# Patient Record
Sex: Male | Born: 1975 | Race: White | Hispanic: No | Marital: Married | Smoking: Never smoker
Health system: Southern US, Community
[De-identification: ages and names within clinical notes are randomized; demographics above are authoritative.]

## PROBLEM LIST (undated history)

## (undated) DIAGNOSIS — F419 Anxiety disorder, unspecified: Secondary | ICD-10-CM

## (undated) DIAGNOSIS — Z87820 Personal history of traumatic brain injury: Secondary | ICD-10-CM

## (undated) DIAGNOSIS — K219 Gastro-esophageal reflux disease without esophagitis: Secondary | ICD-10-CM

## (undated) DIAGNOSIS — G43909 Migraine, unspecified, not intractable, without status migrainosus: Secondary | ICD-10-CM

## (undated) DIAGNOSIS — R000 Tachycardia, unspecified: Secondary | ICD-10-CM

## (undated) DIAGNOSIS — M722 Plantar fascial fibromatosis: Secondary | ICD-10-CM

## (undated) DIAGNOSIS — R739 Hyperglycemia, unspecified: Secondary | ICD-10-CM

## (undated) HISTORY — DX: Hyperglycemia, unspecified: R73.9

## (undated) HISTORY — PX: WISDOM TOOTH EXTRACTION: SHX21

## (undated) HISTORY — DX: Personal history of traumatic brain injury: Z87.820

## (undated) HISTORY — DX: Migraine, unspecified, not intractable, without status migrainosus: G43.909

## (undated) HISTORY — DX: Plantar fascial fibromatosis: M72.2

---

## 2001-07-18 ENCOUNTER — Emergency Department (HOSPITAL_COMMUNITY): Admission: EM | Admit: 2001-07-18 | Discharge: 2001-07-19 | Payer: Self-pay | Admitting: Emergency Medicine

## 2001-07-19 ENCOUNTER — Encounter: Payer: Self-pay | Admitting: Emergency Medicine

## 2001-07-20 ENCOUNTER — Emergency Department (HOSPITAL_COMMUNITY): Admission: EM | Admit: 2001-07-20 | Discharge: 2001-07-20 | Payer: Self-pay | Admitting: *Deleted

## 2012-08-07 LAB — HM HEPATITIS C SCREENING LAB: HM Hepatitis Screen: NEGATIVE

## 2012-08-07 LAB — HM HIV SCREENING LAB: HM HIV Screening: NEGATIVE

## 2014-12-22 ENCOUNTER — Encounter: Payer: Self-pay | Admitting: Family Medicine

## 2014-12-22 ENCOUNTER — Ambulatory Visit (INDEPENDENT_AMBULATORY_CARE_PROVIDER_SITE_OTHER): Payer: 59 | Admitting: Family Medicine

## 2014-12-22 VITALS — BP 132/84 | HR 67 | Temp 98.7°F | Ht 75.0 in | Wt 303.2 lb

## 2014-12-22 DIAGNOSIS — R739 Hyperglycemia, unspecified: Secondary | ICD-10-CM | POA: Diagnosis not present

## 2014-12-22 DIAGNOSIS — Z7189 Other specified counseling: Secondary | ICD-10-CM

## 2014-12-22 NOTE — Patient Instructions (Signed)
Use the eat right diet and keep working on your weight.   Send me your next set of labs at work.  I would get a flu shot each fall.   Take care.  Glad to see you.

## 2014-12-22 NOTE — Progress Notes (Signed)
Pre visit review using our clinic review tool, if applicable. No additional management support is needed unless otherwise documented below in the visit note.  New patient.  H/o mild hyperglycemia.  Weight is up, but he weighed 200# and was 6-2 in 9th grade.  Goal weight loss of 1-2# per month d/w pt.  He has cut out soda, is working on diet.  Exercise is usually at work, he is trying to get more.  Labs d/w pt.    PMH and SH reviewed  ROS: See HPI, otherwise noncontributory.  Meds, vitals, and allergies reviewed.   GEN: nad, alert and oriented HEENT: mucous membranes moist NECK: supple w/o LA CV: rrr.  no murmur PULM: ctab, no inc wob ABD: soft, +bs EXT: no edema SKIN: no acute rash

## 2014-12-23 DIAGNOSIS — Z7189 Other specified counseling: Secondary | ICD-10-CM | POA: Insufficient documentation

## 2014-12-23 DIAGNOSIS — R739 Hyperglycemia, unspecified: Secondary | ICD-10-CM | POA: Insufficient documentation

## 2014-12-23 NOTE — Assessment & Plan Note (Signed)
Mild, d/w pt.  He'll work on weight loss, goal 1-2# per month.  Getting BMI to 30 would be a big success.   He is likely not built to be very thin, given his hx and build.  D/w pt.   Handout given re: diet and low carb foods.   Continue to cut sugars, already off soda.  He agrees.  He'll get f/u labs at work, will send to me.

## 2015-01-26 ENCOUNTER — Encounter: Payer: Self-pay | Admitting: Family Medicine

## 2015-01-26 LAB — HEMOGLOBIN A1C
A1c: 5.8
Cholesterol, Total: 206
Creatinine, Ser: 0.77
Glucose: 107
HDL: 38 mg/dL (ref 35–70)
LDL (calc): 138
Triglycerides: 148

## 2015-03-19 LAB — LIPID PANEL
Cholesterol: 202 mg/dL — AB (ref 0–200)
HDL: 36 mg/dL (ref 35–70)
LDL Cholesterol: 128 mg/dL
Triglycerides: 188 mg/dL — AB (ref 40–160)

## 2015-03-19 LAB — BASIC METABOLIC PANEL: Glucose: 99 mg/dL

## 2015-04-22 ENCOUNTER — Ambulatory Visit (INDEPENDENT_AMBULATORY_CARE_PROVIDER_SITE_OTHER): Payer: 59 | Admitting: Family Medicine

## 2015-04-22 ENCOUNTER — Encounter: Payer: Self-pay | Admitting: Family Medicine

## 2015-04-22 VITALS — BP 118/80 | HR 82 | Temp 98.0°F | Wt 309.0 lb

## 2015-04-22 DIAGNOSIS — M779 Enthesopathy, unspecified: Secondary | ICD-10-CM

## 2015-04-22 DIAGNOSIS — M25512 Pain in left shoulder: Secondary | ICD-10-CM

## 2015-04-22 NOTE — Progress Notes (Signed)
Pre visit review using our clinic review tool, if applicable. No additional management support is needed unless otherwise documented below in the visit note.  Fell backward down 13 steps about 2 months ago.  He had rug burn but no bruising.  No LOC.  He didn't hit his head.  Was a little sore but was getting some better.  Then later on started having B trap pain and some tension headaches.  He has a numb spot on his L posterior shoulder near the scapula.  Normal L shoulder ROM.   He is clearly better but still with annoying enough sx to get checked.  He has been going to the chiropractor with some relief.  The HAs are better.   Rare tylenol/advil use.    R elbow pain.  Normal ROM.  No weakness.  Pain in the antecubital area.  No bruising.  Frequently lifting at work.    Pain in the R knee, posterior.  Worse with extreme of flexion, ie squatting.  No other trauma.  H/o known tight hamstrings.    Labs from work reviewed with patient.  D/w pt about weight loss as main tx.    Meds, vitals, and allergies reviewed.   ROS: See HPI.  Otherwise, noncontributory.  Nad ncat Neck supple L shoulder with normal inspection and ROM, no arm drop, but slight pain with int/ext rotation and supraspinatus testing.  + scap assist.   R biceps muscle and elbow not ttp but distal biceps tendon slightly ttp R knee with crepitus noted on ROM with SLR leg but hamstring tight.  Joint line not ttp.  Patella not ttp.  Normal R knee ROM. No bruising.

## 2015-04-22 NOTE — Patient Instructions (Signed)
Use the shoulder exercises and elbow sleeve.  Icing your elbow may help some.  Cushion inserts may help.  Hamstring stretch daily.  Take care.  Glad to see you.

## 2015-04-23 DIAGNOSIS — M25519 Pain in unspecified shoulder: Secondary | ICD-10-CM | POA: Insufficient documentation

## 2015-04-23 DIAGNOSIS — M779 Enthesopathy, unspecified: Secondary | ICD-10-CM | POA: Insufficient documentation

## 2015-04-23 NOTE — Assessment & Plan Note (Signed)
Likely biceps and hamstring tendonitis.  With likely OA changes in the R knee with crepitus noted.  D/w pt about relative rest for R arm, icing and sleeve as needed.   Reasonable to get inserts in boots and work on hamstring stretches in meantime.  If not improved, then update me.  We may need to get him to see Dr. Patsy Lager.   D/w pt about weight reduction.

## 2015-04-23 NOTE — Assessment & Plan Note (Signed)
No sign of full tear.  Likely with cuff irritation, either from the fall or from chronic strain at work.  Handout given, can use home exercises and update me as needed.  He agrees. Shoulder doesn't feel unstable.  Okay for outpatient f/u.

## 2015-05-16 ENCOUNTER — Encounter: Payer: Self-pay | Admitting: Family Medicine

## 2015-05-17 ENCOUNTER — Other Ambulatory Visit: Payer: Self-pay | Admitting: Family Medicine

## 2015-05-17 MED ORDER — HYDROXYZINE HCL 10 MG PO TABS: 10.0000 mg | ORAL_TABLET | Freq: Three times a day (TID) | ORAL | Status: DC | PRN

## 2015-05-27 ENCOUNTER — Encounter: Payer: Self-pay | Admitting: Family Medicine

## 2015-05-28 ENCOUNTER — Encounter: Payer: Self-pay | Admitting: Family Medicine

## 2015-06-03 ENCOUNTER — Ambulatory Visit (INDEPENDENT_AMBULATORY_CARE_PROVIDER_SITE_OTHER): Payer: 59 | Admitting: Family Medicine

## 2015-06-03 ENCOUNTER — Encounter: Payer: Self-pay | Admitting: Family Medicine

## 2015-06-03 VITALS — BP 132/78 | HR 81 | Temp 98.1°F | Wt 309.0 lb

## 2015-06-03 DIAGNOSIS — F4321 Adjustment disorder with depressed mood: Secondary | ICD-10-CM | POA: Diagnosis not present

## 2015-06-03 MED ORDER — ALPRAZOLAM 0.25 MG PO TABS: 0.2500 mg | ORAL_TABLET | Freq: Three times a day (TID) | ORAL | Status: DC | PRN

## 2015-06-03 NOTE — Patient Instructions (Signed)
Use the xanax if needed, sedation caution.  Update me as needed.

## 2015-06-03 NOTE — Assessment & Plan Note (Signed)
He is working through his situation, trying to process the paperwork as it comes in.  No SI/HI, okay for outpatient f/u.  He didn't want to go to counseling.  Hydroxyzine didn't help.  Okay to try xanax with routine cautions.  He agrees.  He'll update me as needed.   He may need another meds if sx are prolonged, ie SSRI, but he doesn't appear to be at that point yet.

## 2015-06-03 NOTE — Progress Notes (Signed)
He had a root canal this AM.  Is on abx.    Anxiety.  Hydroxyzine didn't help.  Mother was killed by a drunk driver- the other driver was caught after the event.  He is still having to deal with lawyers and paperwork, ie insurance.  No SI/HI.  He is realistic about the recent events.  He doesn't drink smoke or use drugs.  He can get irritated (understandibly) when insurance agents/etc call him to ask for repeat paperwork, details, etc.  He doesn't act out on the frustration.   He is working to lose weight with diet and exercise, d/w pt.   Form done for work.   Meds, vitals, and allergies reviewed.   ROS: See HPI.  Otherwise, noncontributory.  nad Affect slightly flat Speech fluent, judgement intact No SI/HI

## 2015-06-12 ENCOUNTER — Emergency Department
Admission: EM | Admit: 2015-06-12 | Discharge: 2015-06-12 | Disposition: A | Discharge status: Home / Self Care | Payer: 59 | Attending: Emergency Medicine | Admitting: Emergency Medicine

## 2015-06-12 ENCOUNTER — Encounter: Payer: Self-pay | Admitting: Emergency Medicine

## 2015-06-12 ENCOUNTER — Emergency Department: Payer: 59

## 2015-06-12 DIAGNOSIS — Z792 Long term (current) use of antibiotics: Secondary | ICD-10-CM | POA: Insufficient documentation

## 2015-06-12 DIAGNOSIS — Z79899 Other long term (current) drug therapy: Secondary | ICD-10-CM | POA: Insufficient documentation

## 2015-06-12 DIAGNOSIS — F329 Major depressive disorder, single episode, unspecified: Secondary | ICD-10-CM | POA: Insufficient documentation

## 2015-06-12 DIAGNOSIS — R079 Chest pain, unspecified: Secondary | ICD-10-CM | POA: Diagnosis present

## 2015-06-12 HISTORY — DX: Anxiety disorder, unspecified: F41.9

## 2015-06-12 LAB — CBC
HCT: 43.9 % (ref 40.0–52.0)
HEMOGLOBIN: 14.7 g/dL (ref 13.0–18.0)
MCH: 28.3 pg (ref 26.0–34.0)
MCHC: 33.6 g/dL (ref 32.0–36.0)
MCV: 84.1 fL (ref 80.0–100.0)
PLATELETS: 227 10*3/uL (ref 150–440)
RBC: 5.21 MIL/uL (ref 4.40–5.90)
RDW: 13 % (ref 11.5–14.5)
WBC: 10.1 10*3/uL (ref 3.8–10.6)

## 2015-06-12 LAB — BASIC METABOLIC PANEL
ANION GAP: 8 (ref 5–15)
BUN: 11 mg/dL (ref 6–20)
CALCIUM: 8.9 mg/dL (ref 8.9–10.3)
CO2: 28 mmol/L (ref 22–32)
CREATININE: 0.76 mg/dL (ref 0.61–1.24)
Chloride: 99 mmol/L — ABNORMAL LOW (ref 101–111)
Glucose, Bld: 115 mg/dL — ABNORMAL HIGH (ref 65–99)
Potassium: 3.8 mmol/L (ref 3.5–5.1)
SODIUM: 135 mmol/L (ref 135–145)

## 2015-06-12 LAB — TROPONIN I

## 2015-06-12 NOTE — ED Notes (Signed)
Pt to ed with c/o intermittent chest pain x 2 days.  Pt reports some sob,  Anxiety and stress.  Pt states his mother was killed about 1 month ago and has felt increased stress in his life.  Pt reports pain feels tight in his chest.

## 2015-06-12 NOTE — ED Provider Notes (Signed)
[facility] Emergency Department Provider Note  ____________________________________________  Time seen: 3:30 PM  I have reviewed the triage vital signs and the nursing notes.   HISTORY  Chief Complaint Chest Pain    HPI Roy Horton is a 39 y.o. male who presents with complaints of chest tightness that has been intermittent for the last week. He feels that it got worse today when he was returning home from packing up his mother's effects, she was recently struck by a drunk driver and killed and this is been causing him considerable anxiety and stress which he attributes his chest pain.Denies fevers chills. He denies shortness of breath to me. He does not smoke. He does have a history of mildly elevated blood pressure. Does not take blood pressure medications. Does get regular physicals. Reports mildly elevated lipids but no history of CAD. The pain does not radiate. He describes it as a tightness primarily occurs when he is struggling with stress     Past Medical History  Diagnosis Date  . Hyperglycemia   . Plantar fascia syndrome     s/p injection  . Migraines     improved with diet changes (cutting out MSG)  . History of concussion   . Anxiety     Patient Active Problem List   Diagnosis Date Noted  . Grief reaction 06/03/2015  . Pain in joint, shoulder region 04/23/2015  . Tendonitis 04/23/2015  . Advance care planning 12/23/2014  . Hyperglycemia 12/23/2014    Past Surgical History  Procedure Laterality Date  . Wisdom tooth extraction      Current Outpatient Rx  Name  Route  Sig  Dispense  Refill  . ALPRAZolam (XANAX) 0.25 MG tablet   Oral   Take 1 tablet (0.25 mg total) by mouth 3 (three) times daily as needed for anxiety (sedation caution).   30 tablet   1   . amoxicillin (AMOXIL) 500 MG capsule   Oral   Take 500 mg by mouth 3 (three) times daily.         . NON FORMULARY      Adren-all supplement.  2 tabs a day.            Allergies Avocado  Family History  Problem Relation Age of Onset  . Heart disease Father   . Colon cancer Neg Hx   . Prostate cancer Neg Hx     Social History Social History  Substance Use Topics  . Smoking status: Never Smoker   . Smokeless tobacco: Never Used  . Alcohol Use: No    Review of Systems  Constitutional: Negative for fever. Eyes: Negative for visual changes. ENT: Negative for sore throat Cardiovascular: Positive for chest tightness Respiratory: Negative for shortness of breath. Gastrointestinal: Negative for abdominal pain, vomiting and diarrhea. Genitourinary: Negative for dysuria. Musculoskeletal: Negative for back pain. Skin: Negative for rash. Neurological: Negative for headaches or focal weakness Psychiatric: Positive for depression    ____________________________________________   PHYSICAL EXAM:  VITAL SIGNS: ED Triage Vitals  Enc Vitals Group     BP 06/12/15 1400 177/86 mmHg     Pulse Rate 06/12/15 1400 85     Resp 06/12/15 1400 20     Temp 06/12/15 1400 98 F (36.7 C)     Temp Source 06/12/15 1400 Oral     SpO2 06/12/15 1400 100 %     Weight 06/12/15 1400 305 lb (138.347 kg)     Height 06/12/15 1400  (1.905 m)  Head Cir --      Peak Flow --      Pain Score 06/12/15 1400 5     Pain Loc --      Pain Edu? --      Excl. in GC? --     Constitutional: Alert and oriented. Well appearing and in no distress. Eyes: Conjunctivae are normal.  ENT   Head: Normocephalic and atraumatic.   Mouth/Throat: Mucous membranes are moist. Cardiovascular: Normal rate, regular rhythm. Normal and symmetric distal pulses are present in all extremities. No murmurs, rubs, or gallops. Respiratory: Normal respiratory effort without tachypnea nor retractions. Breath sounds are clear and equal bilaterally.  Gastrointestinal: Soft and non-tender in all quadrants. No distention. There is no CVA tenderness. Genitourinary:  deferred Musculoskeletal: Nontender with normal range of motion in all extremities. No lower extremity tenderness nor edema. Neurologic:  Normal speech and language. No gross focal neurologic deficits are appreciated. Skin:  Skin is warm, dry and intact. No rash noted. Psychiatric: Mood and affect are normal. Patient exhibits appropriate insight and judgment.  ____________________________________________    LABS (pertinent positives/negatives)  Labs Reviewed  BASIC METABOLIC PANEL - Abnormal; Notable for the following:    Chloride 99 (*)    Glucose, Bld 115 (*)    All other components within normal limits  CBC  TROPONIN I  TROPONIN I    ____________________________________________   EKG  ED ECG REPORT I, Jene EveryKINNER, Bayan Kushnir, the attending physician, personally viewed and interpreted this ECG.  Date: 06/12/2015 EKG Time: 2:03 PM Rate: 77 Rhythm: normal sinus rhythm QRS Axis: normal Intervals: normal ST/T Wave abnormalities: normal Conduction Disutrbances: none Narrative Interpretation: unremarkable   ____________________________________________    RADIOLOGY I have personally reviewed any xrays that were ordered on this patient: Chest x-ray normal  ____________________________________________   PROCEDURES  Procedure(s) performed: none  Critical Care performed: none  ____________________________________________   INITIAL IMPRESSION / ASSESSMENT AND PLAN / ED COURSE  Pertinent labs & imaging results that were available during my care of the patient were reviewed by me and considered in my medical decision making (see chart for details).  Patient well-appearing and in no distress. EKG is reassuring and initial cardiac enzymes are normal. Certainly does sound like he is having stress reactions and is causing chest tightness however we will recheck troponins and if negative we will have him follow-up with cardiology for stress test and further  evaluation  ____________________________________________   FINAL CLINICAL IMPRESSION(S) / ED DIAGNOSES  Final diagnoses:  Chest pain, unspecified chest pain type     Jene Everyobert Trisa Cranor, MD 06/12/15 [address] [id number]

## 2015-06-12 NOTE — Discharge Instructions (Signed)
Nonspecific Chest Pain  °Chest pain can be caused by many different conditions. There is always a chance that your pain could be related to something serious, such as a heart attack or a blood clot in your lungs. Chest pain can also be caused by conditions that are not life-threatening. If you have chest pain, it is very important to follow up with your health care provider. °CAUSES  °Chest pain can be caused by: °· Heartburn. °· Pneumonia or bronchitis. °· Anxiety or stress. °· Inflammation around your heart (pericarditis) or lung (pleuritis or pleurisy). °· A blood clot in your lung. °· A collapsed lung (pneumothorax). It can develop suddenly on its own (spontaneous pneumothorax) or from trauma to the chest. °· Shingles infection (varicella-zoster virus). °· Heart attack. °· Damage to the bones, muscles, and cartilage that make up your chest wall. This can include: °¨ Bruised bones due to injury. °¨ Strained muscles or cartilage due to frequent or repeated coughing or overwork. °¨ Fracture to one or more ribs. °¨ Sore cartilage due to inflammation (costochondritis). °RISK FACTORS  °Risk factors for chest pain may include: °· Activities that increase your risk for trauma or injury to your chest. °· Respiratory infections or conditions that cause frequent coughing. °· Medical conditions or overeating that can cause heartburn. °· Heart disease or family history of heart disease. °· Conditions or health behaviors that increase your risk of developing a blood clot. °· Having had chicken pox (varicella zoster). °SIGNS AND SYMPTOMS °Chest pain can feel like: °· Burning or tingling on the surface of your chest or deep in your chest. °· Crushing, pressure, aching, or squeezing pain. °· Dull or sharp pain that is worse when you move, cough, or take a deep breath. °· Pain that is also felt in your back, neck, shoulder, or arm, or pain that spreads to any of these areas. °Your chest pain may come and go, or it may stay  constant. °DIAGNOSIS °Lab tests or other studies may be needed to find the cause of your pain. Your health care provider may have you take a test called an ambulatory ECG (electrocardiogram). An ECG records your heartbeat patterns at the time the test is performed. You may also have other tests, such as: °· Transthoracic echocardiogram (TTE). During echocardiography, sound waves are used to create a picture of all of the heart structures and to look at how blood flows through your heart. °· Transesophageal echocardiogram (TEE). This is a more advanced imaging test that obtains images from inside your body. It allows your health care provider to see your heart in finer detail. °· Cardiac monitoring. This allows your health care provider to monitor your heart rate and rhythm in real time. °· Holter monitor. This is a portable device that records your heartbeat and can help to diagnose abnormal heartbeats. It allows your health care provider to track your heart activity for several days, if needed. °· Stress tests. These can be done through exercise or by taking medicine that makes your heart beat more quickly. °· Blood tests. °· Imaging tests. °TREATMENT  °Your treatment depends on what is causing your chest pain. Treatment may include: °· Medicines. These may include: °¨ Acid blockers for heartburn. °¨ Anti-inflammatory medicine. °¨ Pain medicine for inflammatory conditions. °¨ Antibiotic medicine, if an infection is present. °¨ Medicines to dissolve blood clots. °¨ Medicines to treat coronary artery disease. °· Supportive care for conditions that do not require medicines. This may include: °¨ Resting. °¨ Applying heat   or cold packs to injured areas. °¨ Limiting activities until pain decreases. °HOME CARE INSTRUCTIONS °· If you were prescribed an antibiotic medicine, finish it all even if you start to feel better. °· Avoid any activities that bring on chest pain. °· Do not use any tobacco products, including  cigarettes, chewing tobacco, or electronic cigarettes. If you need help quitting, ask your health care provider. °· Do not drink alcohol. °· Take medicines only as directed by your health care provider. °· Keep all follow-up visits as directed by your health care provider. This is important. This includes any further testing if your chest pain does not go away. °· If heartburn is the cause for your chest pain, you may be told to keep your head raised (elevated) while sleeping. This reduces the chance that acid will go from your stomach into your esophagus. °· Make lifestyle changes as directed by your health care provider. These may include: °¨ Getting regular exercise. Ask your health care provider to suggest some activities that are safe for you. °¨ Eating a heart-healthy diet. A registered dietitian can help you to learn healthy eating options. °¨ Maintaining a healthy weight. °¨ Managing diabetes, if necessary. °¨ Reducing stress. °SEEK MEDICAL CARE IF: °· Your chest pain does not go away after treatment. °· You have a rash with blisters on your chest. °· You have a fever. °SEEK IMMEDIATE MEDICAL CARE IF:  °· Your chest pain is worse. °· You have an increasing cough, or you cough up blood. °· You have severe abdominal pain. °· You have severe weakness. °· You faint. °· You have chills. °· You have sudden, unexplained chest discomfort. °· You have sudden, unexplained discomfort in your arms, back, neck, or jaw. °· You have shortness of breath at any time. °· You suddenly start to sweat, or your skin gets clammy. °· You feel nauseous or you vomit. °· You suddenly feel light-headed or dizzy. °· Your heart begins to beat quickly, or it feels like it is skipping beats. °These symptoms may represent a serious problem that is an emergency. Do not wait to see if the symptoms will go away. Get medical help right away. Call your local emergency services (911 in the [place] Do not drive yourself to the hospital. °  °This  information is not intended to replace advice given to you by your health care provider. Make sure you discuss any questions you have with your health care provider. °  °Document Released: 05/03/2005 Document Revised: 08/14/2014 Document Reviewed: 02/27/2014 °Elsevier Interactive Patient Education ©2016 [employer] ° °

## 2015-06-14 ENCOUNTER — Encounter: Payer: Self-pay | Admitting: Family Medicine

## 2015-06-15 ENCOUNTER — Other Ambulatory Visit: Payer: Self-pay | Admitting: Family Medicine

## 2015-06-15 MED ORDER — ALPRAZOLAM 0.5 MG PO TABS: 0.5000 mg | ORAL_TABLET | Freq: Two times a day (BID) | ORAL | Status: DC | PRN

## 2015-06-15 NOTE — Progress Notes (Unsigned)
Please call in.  Thanks.  Note dose change.

## 2015-06-17 ENCOUNTER — Encounter: Payer: Self-pay | Admitting: Family Medicine

## 2015-06-17 ENCOUNTER — Ambulatory Visit (INDEPENDENT_AMBULATORY_CARE_PROVIDER_SITE_OTHER): Payer: 59 | Admitting: Family Medicine

## 2015-06-17 VITALS — BP 130/76 | HR 75 | Temp 98.7°F | Ht 75.0 in | Wt 318.5 lb

## 2015-06-17 DIAGNOSIS — G2589 Other specified extrapyramidal and movement disorders: Secondary | ICD-10-CM | POA: Diagnosis not present

## 2015-06-17 NOTE — Patient Instructions (Signed)
YOUTUBE  Scapular stabilization exercises physical therapy  Pectoralis minor stretching   REFERRALS TO SPECIALISTS, SPECIAL TESTS (MRI, CT, ULTRASOUNDS)  [facility] or [facility] will help you. ASK CHECK-IN FOR HELP.  Imaging / Special Testing referrals sometimes can be done same day if EMERGENCY, but others can take 2 or 3 days to get an appointment. Starting in 2015, many of the new Medicare plans and Obamacare plans take much longer.   Specialist appointment times vary a great deal, based on their schedule / openings. -- Some specialists have very long wait times. (Example. Dermatology. Multiple months  for non-cancer)

## 2015-06-17 NOTE — Progress Notes (Signed)
Dr. Karleen Hampshire T. Amaurie Schreckengost, MD, CAQ Sports Medicine Primary Care and Sports Medicine [address] [place], [address] Phone: [phone] Fax: [phone]  06/17/2015  Patient: Roy Horton, MRN: [id number], DOB: [birth year], 39 y.o.  Primary Physician:  Crawford Givens, MD   Chief Complaint  Patient presents with  . Fall    x 2 months ago  . Shoulder Pain    Left   . Chest Pain    Left-Seen in ER-11/5 Following up with Dr. Lenard Galloway   Subjective:   Dear Dr. Para March,  Thank you for having me see Cloretta Ned in consultation today at [facility] at [facility] for his problem with L shoulder pain.  As you may recall, he is a 39 y.o. year old male with a history of a fall and approximately May or June 2016, where he fell down the stairs.  After this, for a brief period he had some pain which got somewhat better, and  Over the last few months it is progressively gotten worse.  His range of motion is a sickly preserved, but he has some pain with abduction and internal range of motion.  He also has pain in the scapular region, more inferiorly.  Patient also is had some chest pain, he was just in the emergency room on June 12, 2015.  He has follow-up with cardiology tomorrow.  Over the last week he has had some dull ache and pain in his shoulder and shoulder blade region.  Complicating things, he is an acute grief.  For the sudden death of his mother.  Patient Active Problem List   Diagnosis Date Noted  . Chest discomfort 06/18/2015  . Grief reaction 06/03/2015  . Pain in joint, shoulder region 04/23/2015  . Tendonitis 04/23/2015  . Advance care planning 12/23/2014  . Hyperglycemia 12/23/2014    Past Medical History  Diagnosis Date  . Hyperglycemia   . Plantar fascia syndrome     s/p injection  . Migraines     improved with diet changes (cutting out MSG)  . History of concussion   . Anxiety     Past Surgical History  Procedure Laterality Date  .  Wisdom tooth extraction      Social History   Social History  . Marital Status: Married    Spouse Name: N/A  . Number of Children: N/A  . Years of Education: N/A   Occupational History  . Not on file.   Social History Main Topics  . Smoking status: Never Smoker   . Smokeless tobacco: Never Used  . Alcohol Use: No  . Drug Use: No  . Sexual Activity: Not on file   Other Topics Concern  . Not on file   Social History Narrative   Married 2001   Works at [employer], [employer] architect    Family History  Problem Relation Age of Onset  . Heart disease Father   . Colon cancer Neg Hx   . Prostate cancer Neg Hx     Allergies  Allergen Reactions  . Avocado Other (See Comments)    GI upset    Medication list reviewed and updated in full in [facility].   GEN: No fevers, chills. Nontoxic. Primarily MSK c/o today. MSK: Detailed in the HPI GI: tolerating PO intake without difficulty Neuro: No numbness, parasthesias, or tingling associated. Otherwise the pertinent positives of the ROS are noted above.   Objective:   BP 130/76 mmHg  Pulse 75  Temp(Src) 98.7  F (37.1 C) (Oral)  Ht 6\' 3"  (1.905 m)  Wt 318 lb 8 oz (144.471 kg)  BMI 39.81 kg/m2    GEN: WDWN, NAD, Non-toxic, Alert & Oriented x 3 HEENT: Atraumatic, Normocephalic.  Ears and Nose: No external deformity. EXTR: No clubbing/cyanosis/edema NEURO: Normal gait.  PSYCH: Normally interactive. Conversant. Not depressed or anxious appearing.  Calm demeanor.   Shoulder: B Inspection: No muscle wasting or winging, FORWARD SLOPING SHOULDERS Ecchymosis/edema: neg  AC joint, scapula, clavicle: NT Cervical spine: NT, full ROM Spurling's: neg Abduction: full, 5/5, MILD PAIN Flexion: full, 5/5 IR, full, lift-off: 5/5 ER at neutral: full, 5/5 AC crossover and compression: neg Neer: neg Hawkins: MILD PAIN Drop Test: neg Empty Can: neg Supraspinatus insertion: NT Bicipital groove: NT Speed's: neg Yergason's:  neg Sulcus sign: neg Scapular dyskinesis: MILD-MOD WINGING, FORWARD SLOPING SHOULDERS, TIGHT PEC MINOR B C5-T1 intact Sensation intact Grip 5/5   Radiology:  Dg Chest 2 View  06/12/2015  CLINICAL DATA:  39 year old male with chest tightness for the past 2 days. Left-sided shoulder pain and numbness. EXAM: CHEST  2 VIEW COMPARISON:  No priors. FINDINGS: Lung volumes are normal. No consolidative airspace disease. No pleural effusions. No pneumothorax. No pulmonary nodule or mass noted. Pulmonary vasculature and the cardiomediastinal silhouette are within normal limits. IMPRESSION: No radiographic evidence of acute cardiopulmonary disease. Electronically Signed   By: Trudie Reedaniel  Entrikin M.D.   On: 06/12/2015 14:45    Assessment and Plan:   Scapular dyskinesis - Plan: Ambulatory referral to Physical Therapy  Very poor shoulder mechanics.  Some of this is likely due to a 20 year career lifting heavy boxes, but more likely secondary to trauma, and reaction to pain.  Primary goal is to work on scapular control and scapular stabilization, which I think will give the patient some improved subacromial space.  The patient's chest pain does not appear to be costochondritis or classic musculoskeletal chest pain.  Follow-up: 8 weeks  Orders Placed This Encounter  Procedures  . Ambulatory referral to Physical Therapy    Thank you for having us see Cloretta NedKeith R Dittrich in consultation.  Feel free to contact me with any questions.  Signed,  Elpidio GaleaSpencer T. Reece Fehnel, MD   Patient's Medications  New Prescriptions   No medications on file  Previous Medications   ALPRAZOLAM (XANAX) 0.5 MG TABLET    Take 1 tablet (0.5 mg total) by mouth 2 (two) times daily as needed for anxiety (sedation caution).  Modified Medications   No medications on file  Discontinued Medications   AMOXICILLIN (AMOXIL) 500 MG CAPSULE    Take 500 mg by mouth 3 (three) times daily.   NON FORMULARY    Adren-all supplement.  2 tabs a day.

## 2015-06-17 NOTE — Progress Notes (Signed)
Pre visit review using our clinic review tool, if applicable. No additional management support is needed unless otherwise documented below in the visit note. 

## 2015-06-18 ENCOUNTER — Ambulatory Visit (INDEPENDENT_AMBULATORY_CARE_PROVIDER_SITE_OTHER): Payer: 59 | Admitting: Cardiovascular Disease

## 2015-06-18 ENCOUNTER — Encounter: Payer: Self-pay | Admitting: Cardiovascular Disease

## 2015-06-18 ENCOUNTER — Encounter (INDEPENDENT_AMBULATORY_CARE_PROVIDER_SITE_OTHER): Payer: 59

## 2015-06-18 VITALS — BP 138/90 | HR 79 | Ht 76.0 in | Wt 314.0 lb

## 2015-06-18 DIAGNOSIS — R079 Chest pain, unspecified: Secondary | ICD-10-CM

## 2015-06-18 DIAGNOSIS — F4321 Adjustment disorder with depressed mood: Secondary | ICD-10-CM

## 2015-06-18 DIAGNOSIS — R739 Hyperglycemia, unspecified: Secondary | ICD-10-CM

## 2015-06-18 DIAGNOSIS — R0789 Other chest pain: Secondary | ICD-10-CM

## 2015-06-18 NOTE — Assessment & Plan Note (Signed)
Symptoms of his left chest possibly exacerbated by recent loss of his mother through traumatic circumstances

## 2015-06-18 NOTE — Patient Instructions (Signed)
Your stress test was normal.  Please call the office if you have any more chest pain  Call or return to clinic prn if these symptoms worsen or fail to improve as anticipated.

## 2015-06-18 NOTE — Patient Instructions (Signed)
You are doing well. No medication changes were made.  We will schedule a treadmill study for chest pain  Please call us if you have new issues that need to be addressed before your next appt.

## 2015-06-18 NOTE — Assessment & Plan Note (Signed)
We have encouraged continued exercise, careful diet management in an effort to lose weight. 

## 2015-06-18 NOTE — Assessment & Plan Note (Signed)
Atypical left-sided chest pain. No significant risk factors for coronary artery disease, no smoking, no diabetes. Cholesterol around 200. He is very concerned about his symptoms. Routine treadmill study will be ordered to rule out ischemia, arrhythmia. Given his chronic left shoulder discomfort, suspect symptoms are musculoskeletal in nature
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
## 2015-06-21 LAB — EXERCISE TOLERANCE TEST
CHL CUP RESTING HR STRESS: 93 {beats}/min
CSEPED: 9 min
CSEPEDS: 0 s
Estimated workload: 10.1 METS
MPHR: 153 {beats}/min
Peak HR: 171 {beats}/min
Percent HR: 111 %

## 2015-08-11 ENCOUNTER — Encounter: Payer: Self-pay | Admitting: Family Medicine

## 2015-08-13 ENCOUNTER — Other Ambulatory Visit: Payer: Self-pay | Admitting: Family Medicine

## 2015-08-13 ENCOUNTER — Other Ambulatory Visit: Payer: Self-pay

## 2015-08-13 MED ORDER — ALPRAZOLAM 0.5 MG PO TABS: 0.5000 mg | ORAL_TABLET | Freq: Two times a day (BID) | ORAL | Status: DC | PRN

## 2015-08-13 NOTE — Progress Notes (Signed)
Please call in xanax rx  Thanks 

## 2015-08-13 NOTE — Telephone Encounter (Signed)
Rx called into pharmacy Xanax as instructed per Para Marchuncan

## 2015-08-16 ENCOUNTER — Ambulatory Visit: Payer: 59 | Admitting: Family Medicine

## 2015-12-10 ENCOUNTER — Ambulatory Visit (INDEPENDENT_AMBULATORY_CARE_PROVIDER_SITE_OTHER)
Admission: RE | Admit: 2015-12-10 | Discharge: 2015-12-10 | Disposition: A | Discharge status: Home / Self Care | Payer: 59 | Source: Ambulatory Visit | Attending: Family Medicine | Admitting: Family Medicine

## 2015-12-10 ENCOUNTER — Encounter: Payer: Self-pay | Admitting: Family Medicine

## 2015-12-10 ENCOUNTER — Ambulatory Visit (INDEPENDENT_AMBULATORY_CARE_PROVIDER_SITE_OTHER): Payer: 59 | Admitting: Family Medicine

## 2015-12-10 VITALS — BP 148/92 | HR 78 | Temp 98.4°F | Wt 323.5 lb

## 2015-12-10 DIAGNOSIS — M542 Cervicalgia: Secondary | ICD-10-CM

## 2015-12-10 DIAGNOSIS — F4321 Adjustment disorder with depressed mood: Secondary | ICD-10-CM

## 2015-12-10 DIAGNOSIS — M25512 Pain in left shoulder: Secondary | ICD-10-CM

## 2015-12-10 MED ORDER — ALPRAZOLAM 0.5 MG PO TABS: 0.5000 mg | ORAL_TABLET | Freq: Two times a day (BID) | ORAL | Status: DC | PRN

## 2015-12-10 MED ORDER — GABAPENTIN 100 MG PO CAPS: 100.0000 mg | ORAL_CAPSULE | Freq: Every evening | ORAL | Status: DC | PRN
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
## 2015-12-10 NOTE — Patient Instructions (Signed)
Go to the lab on the way out.  We'll contact you with your xray report. Keep stretching, try 100-->300mg  gabapentin at night.   We'll be in touch.  Take care.  Glad to see you.

## 2015-12-12 NOTE — Assessment & Plan Note (Signed)
Continue prn BZD.  No ADE.  Okay to continue as is.  He is working through all of the legal requirements re: his mother's death, and processing the loss itself.

## 2015-12-12 NOTE — Assessment & Plan Note (Signed)
His neck pain is likely from from muscle tension and that is likely contributing to HA, d/w pt.  He does have some neuropathic sx with tingling but no weakness and no dec in DTRs.   Keep stretching, try 100mg -->300mg  gabapentin at night with routine cautions.  Check neck and shoulder xrays.   See notes on imaging.   He agrees.   >25 minutes spent in face to face time with patient, >50% spent in counselling or coordination of care.

## 2015-12-13 ENCOUNTER — Telehealth: Payer: Self-pay | Admitting: Family Medicine

## 2015-12-13 NOTE — Telephone Encounter (Signed)
Patient advised and is agreeable.

## 2015-12-13 NOTE — Telephone Encounter (Signed)
Call pt.  I talked with Dr. Patsy Lageropland.  I'm still not sure how much of his trouble is coming from the shoulder vs the neck vs one aggravating the other.   I think the gabapentin is still worth a try, but if not better with that then I think seeing Dr. Patsy Lageropland again would be useful.  Thanks.

## 2015-12-29 ENCOUNTER — Encounter: Payer: Self-pay | Admitting: Family Medicine

## 2016-01-12 ENCOUNTER — Ambulatory Visit (INDEPENDENT_AMBULATORY_CARE_PROVIDER_SITE_OTHER): Payer: 59 | Admitting: Family Medicine

## 2016-01-12 ENCOUNTER — Encounter: Payer: Self-pay | Admitting: Family Medicine

## 2016-01-12 VITALS — BP 160/98 | HR 77 | Temp 98.6°F | Ht 76.0 in | Wt 334.0 lb

## 2016-01-12 DIAGNOSIS — M25512 Pain in left shoulder: Secondary | ICD-10-CM | POA: Diagnosis not present

## 2016-01-12 NOTE — Progress Notes (Signed)
Dr. Karleen HampshireSpencer T. Naijah Lacek, MD, CAQ Sports Medicine Primary Care and Sports Medicine [address] [place], [id number] Phone: [phone] Fax: [phone]  01/12/2016  Patient: Roy Horton, MRN: [id number], DOB: 10/26/1975, 40 y.o.  Primary Physician:  Crawford GivensGraham Duncan, MD   Chief Complaint  Patient presents with  . Follow-up    Neck & Shoulder Pain   Subjective:   Roy NedKeith R Current is a 40 y.o. very pleasant male patient who presents with the following:  12 months - shoulder pain. Gabapentin and xanax has helped some with some headaches. He also was having some pain in the neck and shoulder. Also weakness in the shoulder. Larey SeatFell down the stairs about 1 year ago, s/p PT, NSAIDS, cont conservative care for months and now he has worsened - certainly not better.   Left sided shoulder pain and a numbness. That night will have a pancake, handling boxes all day long. Repetitive.   Headaches is really bothering him.   [facility]  Past Medical History, Surgical History, Social History, Family History, Problem List, Medications, and Allergies have been reviewed and updated if relevant.  Patient Active Problem List   Diagnosis Date Noted  . Chest discomfort 06/18/2015  . Grief reaction 06/03/2015  . Pain in joint, shoulder region 04/23/2015  . Tendonitis 04/23/2015  . Advance care planning 12/23/2014  . Hyperglycemia 12/23/2014    Past Medical History  Diagnosis Date  . Hyperglycemia   . Plantar fascia syndrome     s/p injection  . Migraines     improved with diet changes (cutting out MSG)  . History of concussion   . Anxiety     Past Surgical History  Procedure Laterality Date  . Wisdom tooth extraction      Social History   Social History  . Marital Status: Married    Spouse Name: N/A  . Number of Children: N/A  . Years of Education: N/A   Occupational History  . Not on file.   Social History Main Topics  . Smoking status: Never Smoker   .  Smokeless tobacco: Never Used  . Alcohol Use: No  . Drug Use: No  . Sexual Activity: Not on file   Other Topics Concern  . Not on file   Social History Narrative   Married 2001   Works at [employer], [employer]    Family History  Problem Relation Age of Onset  . Heart disease Father   . Colon cancer Neg Hx   . Prostate cancer Neg Hx     Allergies  Allergen Reactions  . Avocado Other (See Comments)    GI upset    Medication list reviewed and updated in full in [facility].  GEN: No fevers, chills. Nontoxic. Primarily MSK c/o today. MSK: Detailed in the HPI GI: tolerating PO intake without difficulty Neuro: No numbness, parasthesias, or tingling associated. Otherwise the pertinent positives of the ROS are noted above.   Objective:   BP 160/98 mmHg  Pulse 77  Temp(Src) 98.6 F (37 C) (Oral)  Ht 6\' 4"  (1.93 m)  Wt 334 lb (151.501 kg)  BMI 40.67 kg/m2   GEN: Well-developed,well-nourished,in no acute distress; alert,appropriate and cooperative throughout examination HEENT: Normocephalic and atraumatic without obvious abnormalities. Ears, externally no deformities PULM: Breathing comfortably in no respiratory distress EXT: No clubbing, cyanosis, or edema PSYCH: Normally interactive. Cooperative during the interview. Pleasant. Friendly and conversant. Not anxious or depressed appearing. Normal, full affect.  Shoulder: L  Inspection: No muscle wasting or winging Ecchymosis/edema: neg  AC joint, scapula, clavicle: NT Cervical spine: NT, full ROM Spurling's: neg Abduction: full, 4/5 Flexion: full, 5/5 IR, full, lift-off: 5/5 ER at neutral: full, 5/5 AC crossover: neg Neer: pos Hawkins: pos Drop Test: neg Empty Can: pos Supraspinatus insertion: mild-mod T Bicipital groove: NT Speed's: neg Yergason's: neg Sulcus sign: neg Scapular dyskinesis: none C5-T1 intact  Neuro: Sensation intact Grip 5/5   Radiology: Dg Cervical Spine Complete  12/10/2015   CLINICAL DATA:  Neck pain.  Fell down stairs 1 year ago. EXAM: CERVICAL SPINE - COMPLETE 4+ VIEW COMPARISON:  None. FINDINGS: Normal alignment. Disc spaces are maintained. Prevertebral soft tissues are normal. Early degenerative facet disease. No fracture. IMPRESSION: Early facet disease.  Normal alignment.  No fracture. Electronically Signed   By: Charlett Nose M.D.   On: 12/10/2015 10:42   Dg Shoulder Left  12/10/2015  CLINICAL DATA:  Pain left shoulder.  Fall.  Initial evaluation . EXAM: LEFT SHOULDER - 2+ VIEW COMPARISON:  No recent prior. FINDINGS: No acute bony or joint abnormality identified. No evidence of fracture or dislocation. IMPRESSION: No acute abnormality. Electronically Signed   By: Maisie Fus  Register   On: 12/10/2015 10:31    Assessment and Plan:   Shoulder pain, left - Plan: MR Shoulder Left Wo Contrast  Obtain an MRI of the L shoulder to evaluate for potential rotator cuff tear given weakness in abduction and fall down the stairs. Failure of traditional conservative measures including PT and NSAIDS x 12 months.   Some of additional pains he described likely from neck, and suggested that he could increase his neurontin dosing to BID or TID.   Orders Placed This Encounter  Procedures  . MR Shoulder Left Wo Contrast    Signed,  Delrico Minehart T. Monti Jilek, MD   Patient's Medications  New Prescriptions   No medications on file  Previous Medications   ALPRAZOLAM (XANAX) 0.5 MG TABLET    Take 1 tablet (0.5 mg total) by mouth 2 (two) times daily as needed for anxiety (sedation caution).   GABAPENTIN (NEURONTIN) 100 MG CAPSULE    Take 1-3 capsules (100-300 mg total) by mouth at bedtime as needed.  Modified Medications   No medications on file  Discontinued Medications   No medications on file

## 2016-01-12 NOTE — Patient Instructions (Signed)

## 2016-01-12 NOTE — Progress Notes (Signed)
Pre visit review using our clinic review tool, if applicable. No additional management support is needed unless otherwise documented below in the visit note. 

## 2016-01-14 ENCOUNTER — Ambulatory Visit (HOSPITAL_COMMUNITY)
Admission: RE | Admit: 2016-01-14 | Discharge: 2016-01-14 | Disposition: A | Discharge status: Home / Self Care | Payer: 59 | Source: Ambulatory Visit | Attending: Family Medicine | Admitting: Family Medicine

## 2016-01-14 ENCOUNTER — Other Ambulatory Visit: Payer: Self-pay | Admitting: Family Medicine

## 2016-01-14 DIAGNOSIS — M25512 Pain in left shoulder: Secondary | ICD-10-CM

## 2016-01-17 ENCOUNTER — Ambulatory Visit: Payer: 59 | Admitting: Family Medicine

## 2016-01-21 ENCOUNTER — Encounter: Payer: Self-pay | Admitting: Family Medicine

## 2016-01-22 ENCOUNTER — Ambulatory Visit
Admission: RE | Admit: 2016-01-22 | Discharge: 2016-01-22 | Disposition: A | Discharge status: Home / Self Care | Payer: 59 | Source: Ambulatory Visit | Attending: Family Medicine | Admitting: Family Medicine

## 2016-01-24 ENCOUNTER — Other Ambulatory Visit: Payer: Self-pay | Admitting: Family Medicine

## 2016-01-24 ENCOUNTER — Ambulatory Visit
Admission: RE | Admit: 2016-01-24 | Discharge: 2016-01-24 | Disposition: A | Discharge status: Home / Self Care | Payer: 59 | Source: Ambulatory Visit | Attending: Family Medicine | Admitting: Family Medicine

## 2016-01-24 DIAGNOSIS — M25512 Pain in left shoulder: Secondary | ICD-10-CM

## 2016-01-26 ENCOUNTER — Encounter: Payer: Self-pay | Admitting: Family Medicine

## 2016-01-27 ENCOUNTER — Other Ambulatory Visit: Payer: Self-pay | Admitting: *Deleted

## 2016-01-27 ENCOUNTER — Other Ambulatory Visit: Payer: Self-pay | Admitting: Family Medicine

## 2016-01-27 DIAGNOSIS — M67912 Unspecified disorder of synovium and tendon, left shoulder: Secondary | ICD-10-CM

## 2016-01-27 DIAGNOSIS — M25512 Pain in left shoulder: Secondary | ICD-10-CM

## 2016-01-27 DIAGNOSIS — M7552 Bursitis of left shoulder: Secondary | ICD-10-CM

## 2016-01-27 DIAGNOSIS — M75112 Incomplete rotator cuff tear or rupture of left shoulder, not specified as traumatic: Secondary | ICD-10-CM

## 2016-01-27 NOTE — Telephone Encounter (Signed)
Patient asked for refill through MyChart.  Patient says this is helping his headaches greatly.  Last Filled:    30 tablet 1 12/10/2015  Last office visit:   01/12/16 Dr. Patsy Lageropland for shoulder pain.  Please advise.

## 2016-01-28 MED ORDER — ALPRAZOLAM 0.5 MG PO TABS: 0.5000 mg | ORAL_TABLET | Freq: Two times a day (BID) | ORAL | Status: DC | PRN

## 2016-01-28 NOTE — Telephone Encounter (Signed)
Medication phoned to pharmacy.  

## 2016-03-27 ENCOUNTER — Encounter
Admission: RE | Admit: 2016-03-27 | Discharge: 2016-03-27 | Disposition: A | Discharge status: Home / Self Care | Payer: 59 | Source: Ambulatory Visit | Attending: Surgery | Admitting: Surgery

## 2016-03-27 HISTORY — DX: Gastro-esophageal reflux disease without esophagitis: K21.9

## 2016-03-27 NOTE — Patient Instructions (Signed)
  Your procedure is scheduled on: 03-30-16 Report to Same Day Surgery 2nd floor medical mall To find out your arrival time please call [phone] between 1PM - 3PM on 03-29-16  Remember: Instructions that are not followed completely may result in serious medical risk, up to and including death, or upon the discretion of your surgeon and anesthesiologist your surgery may need to be rescheduled.    _x___ 1. Do not eat food or drink liquids after midnight. No gum chewing or hard candies.     __x__ 2. No Alcohol for 24 hours before or after surgery.   __x__3. No Smoking for 24 prior to surgery.   ____  4. Bring all medications with you on the day of surgery if instructed.    __x__ 5. Notify your doctor if there is any change in your medical condition     (cold, fever, infections).     Do not wear jewelry, make-up, hairpins, clips or nail polish.  Do not wear lotions, powders, or perfumes. You may wear deodorant.  Do not shave 48 hours prior to surgery. Men may shave face and neck.  Do not bring valuables to the hospital.    [facility] is not responsible for any belongings or valuables.               Contacts, dentures or bridgework may not be worn into surgery.  Leave your suitcase in the car. After surgery it may be brought to your room.  For patients admitted to the hospital, discharge time is determined by your treatment team.   Patients discharged the day of surgery will not be allowed to drive home.    Please read over the following fact sheets that you were given:   [facility] Preparing for Surgery and or MRSA Information   _x___ Take these medicines the morning of surgery with A SIP OF WATER:    1. MAY TAKE XANAX AM OF SURGERY IF NEEDED  2.  3.  4.  5.  6.  ____ Fleet Enema (as directed)   ____ Use CHG Soap or sage wipes as directed on instruction sheet   ____ Use inhalers on the day of surgery and bring to hospital day of surgery  ____ Stop metformin 2 days  prior to surgery    ____ Take 1/2 of usual insulin dose the night before surgery and none on the morning of surgery.   ____ Stop aspirin or coumadin, or plavix  _x__ Stop Anti-inflammatories such as Advil, Aleve, Ibuprofen, Motrin, Naproxen,          Naprosyn, Goodies powders or aspirin products NOW-Ok to take Tylenol.   _X___ Stop supplements until after surgery-PT STOPPED TURMERIC AND FISH OIL ON 03-23-16  ____ Bring C-Pap to the hospital.

## 2016-03-27 NOTE — Pre-Procedure Instructions (Signed)
Roy NedKeith R Westerfeld  EXERCISE TOLERANCE TEST  Order# [id number]  Reading physician: Antonieta Ibaimothy J Gollan, MD Ordering physician: Antonieta Ibaimothy J Gollan, MD Study date: 06/18/15  Patient Information   Name MRN Description  Roy Horton [id number] 40 y.o. Male  Result Notes   Notes Recorded by Marilynne HalstedAmanda R Moody, RN on 06/21/2015 at 4:08 PM Dr. Mariah MillingGollan reviewed results w/ pt during visit.      Vitals   Height Weight BMI (Calculated)  6\' 4"  (1.93 m) 314 lb (142.4 kg) 38.3  Study Highlights   Exercise treadmill study performed for shortness of breath, chest discomfort Excellent exercise tolerance, achieved10.1 METS Total exercise time 9 minutes Peak blood pressure 217/72 Maximum heart rate achieved 171 bpm which was 95% of maximum predicted No ST changes concerning for ischemia at peak stress or in recovery Overall a normal study Results discussed with the patient at the time of the visit  Stress Findings   ECG Baseline ECG exhibits normal sinus rhythm..    Stress Findings The patient exercised following the Bruce protocol.  The patient reported no symptoms during the stress test. The patient experienced no angina during the stress test.   Test was stopped per protocol.   Blood pressure and heart rate demonstrated a normal response to exercise. Overall, the patient's exercise capacity was excellent.   85% of maximum heart rate was achieved after 6 minutes. The patient's response to exercise was adequate for diagnosis. Pt added on for gxt, wearing jeans. C/o being uncomfortably hot during test. Otherwise excellent tolerance.    Stress Measurements   Baseline Vitals  Rest HR 93 bpm    Rest BP 168/89 mmHg    Exercise Time  Exercise duration (min) 9 min    Exercise duration (sec) 0 sec    Peak Stress Vitals  Peak HR 171 bpm    Peak BP 217/72 mmHg    Exercise Data  MPHR 153 bpm    Percent HR 111 %    Estimated workload 10.1 METS       Signed   Electronically signed by  Antonieta Ibaimothy J Gollan, MD on 06/21/15 at 1401 EST  Report approved and finalized on 06/21/2015 1401  Imaging   Imaging Information  Order-Level Documents - 06/18/2015:   Scan on 06/18/2015 11:21 AM by Provider Default, MD      Encounter-Level Documents:   There are no encounter-level documents.  Exam Information   Status Exam Begun  Exam Ended   Final [99] 06/18/2015 10:29 AM 06/18/2015 11:05 AM  External Result Report   External Result Report  Order  Exercise Tolerance Test [[id number]][EKG1181] (Order [id number])  Order Providers   Authorizing Encounter Billing  Antonieta Ibaimothy J Gollan [facility] Antonieta Ibaimothy J Gollan      Original Order   Ordered On Ordered By   06/18/2015 10:22 AM Marilynne HalstedAmanda R Moody, RN           Associated Diagnoses    ICD-9-CM ICD-10-CM  Chest pain, unspecified chest pain type    786.50 R07.9  Order Questions

## 2016-03-30 ENCOUNTER — Other Ambulatory Visit: Payer: Self-pay | Admitting: Primary Care

## 2016-03-30 ENCOUNTER — Encounter: Payer: Self-pay | Admitting: *Deleted

## 2016-03-30 ENCOUNTER — Ambulatory Visit: Payer: 59 | Admitting: Anesthesiology

## 2016-03-30 ENCOUNTER — Encounter
Admission: RE | Disposition: A | Discharge status: Home / Self Care | Payer: Self-pay | Source: Ambulatory Visit | Attending: Surgery

## 2016-03-30 ENCOUNTER — Ambulatory Visit
Admission: RE | Admit: 2016-03-30 | Discharge: 2016-03-30 | Disposition: A | Discharge status: Home / Self Care | Payer: 59 | Source: Ambulatory Visit | Attending: Surgery | Admitting: Surgery

## 2016-03-30 DIAGNOSIS — R739 Hyperglycemia, unspecified: Secondary | ICD-10-CM | POA: Insufficient documentation

## 2016-03-30 DIAGNOSIS — M722 Plantar fascial fibromatosis: Secondary | ICD-10-CM | POA: Insufficient documentation

## 2016-03-30 DIAGNOSIS — M75102 Unspecified rotator cuff tear or rupture of left shoulder, not specified as traumatic: Secondary | ICD-10-CM | POA: Diagnosis present

## 2016-03-30 DIAGNOSIS — M7542 Impingement syndrome of left shoulder: Secondary | ICD-10-CM | POA: Insufficient documentation

## 2016-03-30 DIAGNOSIS — M75112 Incomplete rotator cuff tear or rupture of left shoulder, not specified as traumatic: Secondary | ICD-10-CM | POA: Insufficient documentation

## 2016-03-30 DIAGNOSIS — F419 Anxiety disorder, unspecified: Secondary | ICD-10-CM | POA: Diagnosis not present

## 2016-03-30 DIAGNOSIS — G43909 Migraine, unspecified, not intractable, without status migrainosus: Secondary | ICD-10-CM | POA: Diagnosis not present

## 2016-03-30 DIAGNOSIS — K219 Gastro-esophageal reflux disease without esophagitis: Secondary | ICD-10-CM | POA: Insufficient documentation

## 2016-03-30 DIAGNOSIS — Z8249 Family history of ischemic heart disease and other diseases of the circulatory system: Secondary | ICD-10-CM | POA: Diagnosis not present

## 2016-03-30 DIAGNOSIS — Z91018 Allergy to other foods: Secondary | ICD-10-CM | POA: Insufficient documentation

## 2016-03-30 HISTORY — PX: SHOULDER ARTHROSCOPY WITH OPEN ROTATOR CUFF REPAIR: SHX6092

## 2016-03-30 HISTORY — PX: SHOULDER ARTHROSCOPY WITH SUBACROMIAL DECOMPRESSION: SHX5684

## 2016-03-30 SURGERY — ARTHROSCOPY, SHOULDER WITH REPAIR, ROTATOR CUFF, OPEN
Anesthesia: General | Site: Shoulder | Laterality: Left | Wound class: Clean

## 2016-03-30 MED ORDER — ROPIVACAINE HCL 5 MG/ML IJ SOLN
INTRAMUSCULAR | Status: AC
  Filled 2016-03-30: qty 40

## 2016-03-30 MED ORDER — DEXTROSE 5 % IV SOLN
3.0000 g | Freq: Once | INTRAVENOUS | Status: AC
  Administered 2016-03-30: 3 g via INTRAVENOUS
  Filled 2016-03-30: qty 3000

## 2016-03-30 MED ORDER — NEOSTIGMINE METHYLSULFATE 10 MG/10ML IV SOLN
INTRAVENOUS | Status: DC | PRN
  Administered 2016-03-30: 3 mg via INTRAVENOUS

## 2016-03-30 MED ORDER — OXYCODONE HCL 5 MG/5ML PO SOLN: 5.0000 mg | Freq: Once | ORAL | Status: DC | PRN

## 2016-03-30 MED ORDER — MEPERIDINE HCL 25 MG/ML IJ SOLN: 6.2500 mg | INTRAMUSCULAR | Status: DC | PRN

## 2016-03-30 MED ORDER — PROPOFOL 10 MG/ML IV BOLUS
INTRAVENOUS | Status: DC | PRN
  Administered 2016-03-30: 20 mg via INTRAVENOUS
  Administered 2016-03-30: 200 mg via INTRAVENOUS

## 2016-03-30 MED ORDER — LIDOCAINE HCL (CARDIAC) 20 MG/ML IV SOLN
INTRAVENOUS | Status: DC | PRN
  Administered 2016-03-30: 60 mg via INTRAVENOUS

## 2016-03-30 MED ORDER — LACTATED RINGERS IV SOLN
INTRAVENOUS | Status: DC
  Administered 2016-03-30 (×2): via INTRAVENOUS

## 2016-03-30 MED ORDER — ONDANSETRON HCL 4 MG/2ML IJ SOLN
INTRAMUSCULAR | Status: DC | PRN
  Administered 2016-03-30: 4 mg via INTRAVENOUS

## 2016-03-30 MED ORDER — MIDAZOLAM HCL 5 MG/5ML IJ SOLN
1.0000 mg | Freq: Once | INTRAMUSCULAR | Status: AC
  Administered 2016-03-30: 1 mg via INTRAVENOUS

## 2016-03-30 MED ORDER — FENTANYL CITRATE (PF) 100 MCG/2ML IJ SOLN: 25.0000 ug | INTRAMUSCULAR | Status: DC | PRN

## 2016-03-30 MED ORDER — FENTANYL CITRATE (PF) 100 MCG/2ML IJ SOLN
INTRAMUSCULAR | Status: DC | PRN
  Administered 2016-03-30: 100 ug via INTRAVENOUS
  Administered 2016-03-30: 200 ug via INTRAVENOUS

## 2016-03-30 MED ORDER — ROPIVACAINE HCL 5 MG/ML IJ SOLN
INTRAMUSCULAR | Status: DC | PRN
  Administered 2016-03-30: 30 mL via PERINEURAL

## 2016-03-30 MED ORDER — ROPIVACAINE HCL 5 MG/ML IJ SOLN
INTRAMUSCULAR | Status: AC
  Filled 2016-03-30: qty 20

## 2016-03-30 MED ORDER — OXYCODONE HCL 5 MG PO TABS: 5.0000 mg | ORAL_TABLET | Freq: Once | ORAL | Status: DC | PRN

## 2016-03-30 MED ORDER — EPINEPHRINE HCL 1 MG/ML IJ SOLN
INTRAMUSCULAR | Status: DC | PRN
  Administered 2016-03-30: 2 mL

## 2016-03-30 MED ORDER — DEXMEDETOMIDINE HCL IN NACL 200 MCG/50ML IV SOLN
INTRAVENOUS | Status: DC | PRN
  Administered 2016-03-30: 8 ug via INTRAVENOUS

## 2016-03-30 MED ORDER — OXYCODONE HCL 5 MG PO TABS: 5.0000 mg | ORAL_TABLET | ORAL | 0 refills | Status: DC | PRN

## 2016-03-30 MED ORDER — LIDOCAINE HCL (PF) 4 % IJ SOLN
INTRAMUSCULAR | Status: DC | PRN
  Administered 2016-03-30: 4 mL via RESPIRATORY_TRACT

## 2016-03-30 MED ORDER — DEXAMETHASONE SODIUM PHOSPHATE 4 MG/ML IJ SOLN
INTRAMUSCULAR | Status: DC | PRN
  Administered 2016-03-30: 5 mg via INTRAVENOUS

## 2016-03-30 MED ORDER — EPINEPHRINE HCL 1 MG/ML IJ SOLN
INTRAMUSCULAR | Status: AC
  Filled 2016-03-30: qty 1

## 2016-03-30 MED ORDER — FAMOTIDINE 20 MG PO TABS
ORAL_TABLET | ORAL | Status: AC
  Administered 2016-03-30: 20 mg via ORAL
  Filled 2016-03-30: qty 1

## 2016-03-30 MED ORDER — ROCURONIUM BROMIDE 100 MG/10ML IV SOLN
INTRAVENOUS | Status: DC | PRN
  Administered 2016-03-30: 50 mg via INTRAVENOUS

## 2016-03-30 MED ORDER — BUPIVACAINE-EPINEPHRINE (PF) 0.5% -1:200000 IJ SOLN
INTRAMUSCULAR | Status: AC
  Filled 2016-03-30: qty 30

## 2016-03-30 MED ORDER — FAMOTIDINE 20 MG PO TABS
20.0000 mg | ORAL_TABLET | Freq: Once | ORAL | Status: AC
  Administered 2016-03-30: 20 mg via ORAL

## 2016-03-30 MED ORDER — GLYCOPYRROLATE 0.2 MG/ML IJ SOLN
INTRAMUSCULAR | Status: DC | PRN
  Administered 2016-03-30: 0.4 mg via INTRAVENOUS

## 2016-03-30 MED ORDER — LIDOCAINE HCL (PF) 1 % IJ SOLN
INTRAMUSCULAR | Status: AC
  Filled 2016-03-30: qty 5

## 2016-03-30 MED ORDER — PROMETHAZINE HCL 25 MG/ML IJ SOLN: 6.2500 mg | INTRAMUSCULAR | Status: DC | PRN

## 2016-03-30 MED ORDER — MIDAZOLAM HCL 2 MG/2ML IJ SOLN
INTRAMUSCULAR | Status: DC | PRN
  Administered 2016-03-30: 2 mg via INTRAVENOUS

## 2016-03-30 MED ORDER — MIDAZOLAM HCL 5 MG/5ML IJ SOLN
INTRAMUSCULAR | Status: AC
  Administered 2016-03-30: 1 mg via INTRAVENOUS
  Filled 2016-03-30: qty 5

## 2016-03-30 MED ORDER — BUPIVACAINE-EPINEPHRINE 0.5% -1:200000 IJ SOLN
INTRAMUSCULAR | Status: DC | PRN
  Administered 2016-03-30: 30 mL

## 2016-03-30 SURGICAL SUPPLY — 47 items
ANCH SUT 2 2.9 2 LD TPR NDL (Anchor) ×1 IMPLANT
ANCH SUT KNTLS STRL SHLDR SYS (Anchor) ×1 IMPLANT
ANCHOR JUGGERKNOT WTAP NDL 2.9 (Anchor) ×2 IMPLANT
ANCHOR SUT QUATTRO KNTLS 4.5 (Anchor) ×2 IMPLANT
BIT DRILL JUGRKNT W/NDL BIT2.9 (DRILL) ×1 IMPLANT
BLADE FULL RADIUS 3.5 (BLADE) ×2 IMPLANT
BLADE SHAVER 4.5X7 STR FR (MISCELLANEOUS) IMPLANT
BUR ACROMIONIZER 4.0 (BURR) ×2 IMPLANT
BUR BR 5.5 WIDE MOUTH (BURR) IMPLANT
CANNULA SHAVER 8MMX76MM (CANNULA) ×2 IMPLANT
CHLORAPREP W/TINT 26ML (MISCELLANEOUS) ×4 IMPLANT
COVER MAYO STAND STRL (DRAPES) ×2 IMPLANT
DRAPE IMP U-DRAPE 54X76 (DRAPES) ×4 IMPLANT
DRILL JUGGERKNOT W/NDL BIT 2.9 (DRILL) ×2
DRSG OPSITE POSTOP 4X8 (GAUZE/BANDAGES/DRESSINGS) ×2 IMPLANT
ELECT REM PT RETURN 9FT ADLT (ELECTROSURGICAL) ×2
ELECTRODE REM PT RTRN 9FT ADLT (ELECTROSURGICAL) ×1 IMPLANT
GAUZE PETRO XEROFOAM 1X8 (MISCELLANEOUS) ×2 IMPLANT
GAUZE SPONGE 4X4 12PLY STRL (GAUZE/BANDAGES/DRESSINGS) ×2 IMPLANT
GLOVE BIO SURGEON STRL SZ7.5 (GLOVE) ×4 IMPLANT
GLOVE BIO SURGEON STRL SZ8 (GLOVE) ×4 IMPLANT
GLOVE BIOGEL PI IND STRL 8 (GLOVE) ×1 IMPLANT
GLOVE BIOGEL PI INDICATOR 8 (GLOVE) ×1
GLOVE INDICATOR 8.0 STRL GRN (GLOVE) ×2 IMPLANT
GOWN STRL REUS W/ TWL LRG LVL3 (GOWN DISPOSABLE) ×2 IMPLANT
GOWN STRL REUS W/ TWL XL LVL3 (GOWN DISPOSABLE) ×1 IMPLANT
GOWN STRL REUS W/TWL LRG LVL3 (GOWN DISPOSABLE) ×4
GOWN STRL REUS W/TWL XL LVL3 (GOWN DISPOSABLE) ×2
GRASPER SUT 15 45D LOW PRO (SUTURE) IMPLANT
IV LACTATED RINGER IRRG 3000ML (IV SOLUTION) ×4
IV LR IRRIG 3000ML ARTHROMATIC (IV SOLUTION) ×2 IMPLANT
MANIFOLD NEPTUNE II (INSTRUMENTS) ×2 IMPLANT
MASK FACE SPIDER DISP (MASK) ×2 IMPLANT
MAT BLUE FLOOR 46X72 FLO (MISCELLANEOUS) ×2 IMPLANT
NEEDLE REVERSE CUT 1/2 CRC (NEEDLE) IMPLANT
PACK ARTHROSCOPY SHOULDER (MISCELLANEOUS) ×2 IMPLANT
SLING ARM LRG DEEP (SOFTGOODS) IMPLANT
SLING ULTRA II LG (MISCELLANEOUS) ×2 IMPLANT
STAPLER SKIN PROX 35W (STAPLE) ×2 IMPLANT
STRAP SAFETY BODY (MISCELLANEOUS) ×2 IMPLANT
SUT ETHIBOND 0 MO6 C/R (SUTURE) ×2 IMPLANT
SUT VIC AB 2-0 CT1 27 (SUTURE) ×4
SUT VIC AB 2-0 CT1 TAPERPNT 27 (SUTURE) ×2 IMPLANT
TAPE MICROFOAM 4IN (TAPE) ×2 IMPLANT
TUBING ARTHRO INFLOW-ONLY STRL (TUBING) ×2 IMPLANT
TUBING CONNECTING 10 (TUBING) ×2 IMPLANT
WAND HAND CNTRL MULTIVAC 90 (MISCELLANEOUS) ×2 IMPLANT

## 2016-03-30 NOTE — Anesthesia Postprocedure Evaluation (Signed)
Anesthesia Post Note  Patient: Roy Horton  Procedure(s) Performed: Procedure(s) (LRB): SHOULDER ARTHROSCOPY WITH OPEN ROTATOR CUFF PARTIAL THICKNESS REPAIR (Left) SHOULDER ARTHROSCOPY WITH SUBACROMIAL DECOMPRESSION AND DEBRIDEMENT (Left)  Patient location during evaluation: PACU Anesthesia Type: General Level of consciousness: awake and alert and oriented Pain management: pain level controlled Vital Signs Assessment: post-procedure vital signs reviewed and stable Respiratory status: spontaneous breathing, nonlabored ventilation and respiratory function stable Cardiovascular status: blood pressure returned to baseline and stable Postop Assessment: no signs of nausea or vomiting Anesthetic complications: no    Last Vitals:  Vitals:   03/30/16 1505 03/30/16 1521  BP: 125/73 132/86  Pulse: 66 66  Resp: 14 16  Temp: 36.8 C 36.8 C    Last Pain:  Vitals:   03/30/16 1521  TempSrc: Tympanic  PainSc: 0-No pain                 Shaquil Aldana

## 2016-03-30 NOTE — Transfer of Care (Signed)
Immediate Anesthesia Transfer of Care Note  Patient: Roy Horton  Procedure(s) Performed: Procedure(s): SHOULDER ARTHROSCOPY WITH OPEN ROTATOR CUFF PARTIAL THICKNESS REPAIR (Left) SHOULDER ARTHROSCOPY WITH SUBACROMIAL DECOMPRESSION AND DEBRIDEMENT (Left)  Patient Location: PACU  Anesthesia Type:General  Level of Consciousness: awake, alert , oriented and patient cooperative  Airway & Oxygen Therapy: Patient Spontanous Breathing and Patient connected to nasal cannula oxygen  Post-op Assessment: Report given to RN and Post -op Vital signs reviewed and stable  Post vital signs: Reviewed and stable  Last Vitals:  Vitals:   03/30/16 1203 03/30/16 1213  BP: (!) 157/101 (!) 156/96  Pulse: 72 72  Resp: 14 (!) 21  Temp:      Last Pain:  Vitals:   03/30/16 1008  TempSrc: Oral  PainSc: 3          Complications: No apparent anesthesia complications

## 2016-03-30 NOTE — Discharge Instructions (Addendum)
Keep dressing dry and intact.  °May shower after dressing changed on post-op day #4 (Monday).  °Cover staples with Band-Aids after drying off. °Apply ice frequently to shoulder. °Take ibuprofen 800 mg TID with meals for 7-10 days, then as necessary. °Take oxycodone as prescribed when needed.  °May supplement with ES Tylenol if necessary. °Keep shoulder immobilizer on at all times except may remove for bathing purposes. °Follow-up in 10-14 days or as scheduled. °AMBULATORY SURGERY  °DISCHARGE INSTRUCTIONS ° ° °1) The drugs that you were given will stay in your system until tomorrow so for the next 24 hours you should not: ° °A) Drive an automobile °B) Make any legal decisions °C) Drink any alcoholic beverage ° ° °2) You may resume regular meals tomorrow.  Today it is better to start with liquids and gradually work up to solid foods. ° °You may eat anything you prefer, but it is better to start with liquids, then soup and crackers, and gradually work up to solid foods. ° ° °3) Please notify your doctor immediately if you have any unusual bleeding, trouble breathing, redness and pain at the surgery site, drainage, fever, or pain not relieved by medication. ° ° ° °4) Additional Instructions: ° ° ° °Please contact your physician with any problems or Same Day Surgery at [phone], Monday through Friday 6 am to 4 pm, or [place] [facility] [place] Main number at [phone]. °

## 2016-03-30 NOTE — Anesthesia Procedure Notes (Signed)
Procedure Name: Intubation Date/Time: 03/30/2016 12:37 PM Performed by: Rosaria Ferries, Genisis Sonnier Pre-anesthesia Checklist: Patient identified, Emergency Drugs available, Suction available and Patient being monitored Patient Re-evaluated:Patient Re-evaluated prior to inductionOxygen Delivery Method: Circle system utilized Preoxygenation: Pre-oxygenation with 100% oxygen Intubation Type: IV induction Laryngoscope Size: Mac and 3 Grade View: Grade I Number of attempts: 1 Airway Equipment and Method: LTA kit utilized Placement Confirmation: ETT inserted through vocal cords under direct vision,  positive ETCO2 and breath sounds checked- equal and bilateral Secured at: 23 cm Tube secured with: Tape Dental Injury: Teeth and Oropharynx as per pre-operative assessment

## 2016-03-30 NOTE — Anesthesia Preprocedure Evaluation (Signed)
Anesthesia Evaluation  Patient identified by MRN, date of birth, ID band Patient awake    Reviewed: Allergy & Precautions, NPO status , Patient's Chart, lab work & pertinent test results  History of Anesthesia Complications Negative for: history of anesthetic complications  Airway Mallampati: I  TM Distance: >3 FB Neck ROM: Full    Dental  (+) Caps   Pulmonary neg pulmonary ROS, neg sleep apnea, neg COPD,    breath sounds clear to auscultation- rhonchi (-) wheezing      Cardiovascular Exercise Tolerance: Good (-) hypertension(-) CAD and (-) Past MI  Rhythm:Regular Rate:Normal - Systolic murmurs and - Diastolic murmurs    Neuro/Psych  Headaches, Anxiety    GI/Hepatic Neg liver ROS, GERD  ,  Endo/Other  negative endocrine ROSneg diabetes  Renal/GU negative Renal ROS     Musculoskeletal negative musculoskeletal ROS (+)   Abdominal (+) + obese,   Peds  Hematology negative hematology ROS (+)   Anesthesia Other Findings Past Medical History: No date: Anxiety No date: GERD (gastroesophageal reflux disease)     Comment: OCC No date: History of concussion No date: Hyperglycemia No date: Migraines     Comment: improved with diet changes (cutting out MSG) No date: Plantar fascia syndrome     Comment: s/p injection   Reproductive/Obstetrics                             Anesthesia Physical Anesthesia Plan  ASA: II  Anesthesia Plan: General   Post-op Pain Management:  Regional for Post-op pain   Induction: Intravenous  Airway Management Planned: Oral ETT  Additional Equipment:   Intra-op Plan:   Post-operative Plan: Extubation in OR  Informed Consent: I have reviewed the patients History and Physical, chart, labs and discussed the procedure including the risks, benefits and alternatives for the proposed anesthesia with the patient or authorized representative who has indicated his/her  understanding and acceptance.   Dental advisory given  Plan Discussed with: CRNA and Anesthesiologist  Anesthesia Plan Comments:         Anesthesia Quick Evaluation

## 2016-03-30 NOTE — Op Note (Signed)
03/30/2016  1:56 PM  Patient:   Roy Horton  Pre-Op Diagnosis:   Impingement/tendinopathy with partial-thickness rotator cuff tear, left shoulder.  Postoperative diagnosis: Impingement/tendinopathy with partial-thickness rotator cuff tear and labral fraying, left shoulder.  Procedure: Limited arthroscopic debridement, arthroscopic subacromial decompression, and mini-open rotator cuff repair, left shoulder.  Anesthesia: General endotracheal with interscalene block placed preoperatively by the anesthesiologist.  Surgeon:   Maryagnes AmosJ. Jeffrey Zygmund Passero, MD  Assistant:   Horris LatinoLance McGhee, PA-C  Findings: As above. The labrum demonstrated fraying anteriorly and superiorly, but there was no evidence of frank detachment from the glenoid. The biceps tendon itself was in excellent condition, as were the articular surfaces of the glenoid and humerus. The articular side of the rotator cuff was in excellent condition as well.  Complications: None  Fluids:   1000 cc  Estimated blood loss: 5 cc  Tourniquet time: None  Drains: None  Closure: Staples   Brief clinical note: The patient is a 40 year old Horton with a history of left shoulder pain. The patient's symptoms have progressed despite medications, activity modification, etc. The patient's history and examination are consistent with impingement/tendinopathy with a probable rotator cuff tear. An MRI scan of the left shoulder demonstrated moderate tendinopathy changes with a probable bursal surface partial thickness rotator cuff tear. The patient presents at this time for definitive management of these shoulder symptoms.  Procedure: The patient underwent placement of an interscalene block by the anesthesiologist in the preoperative holding area before he was brought into the operating room and lain in the supine position. The patient then underwent general endotracheal intubation and anesthesia before being repositioned in the beach  chair position using the beach chair positioner. The left shoulder and upper extremity were prepped with ChloraPrep solution before being draped sterilely. Preoperative antibiotics were administered. A timeout was performed to confirm the proper surgical site before the expected portal sites and incision site were injected with 0.5% Sensorcaine with epinephrine. A posterior portal was created and the glenohumeral joint thoroughly inspected with the findings as described above. An anterior portal was created using an outside-in technique. The labrum and rotator cuff were further probed, again confirming the above-noted findings. The areas of labral fraying and synovitis were lightly debrided using the full-radius resector before the ArthroCare wand was inserted and used to obtain hemostasis as well as to "anneal" the labrum superiorly and anteriorly. The instruments were removed from the joint after suctioning the excess fluid.  The camera was repositioned through the posterior portal into the subacromial space. A separate lateral portal was created using an outside-in technique. The 3.5 mm full-radius resector was introduced and used to perform a subtotal bursectomy. The ArthroCare wand was then inserted and used to remove the periosteal tissue off the undersurface of the anterior third of the acromion as well as to recess the coracoacromial ligament from its attachment along the anterior and lateral margins of the acromion. The 4.0 mm acromionizing bur was introduced and used to complete the decompression by removing the undersurface of the anterior third of the acromion. The full radius resector was reintroduced to remove any residual bony debris before the ArthroCare wand was reintroduced to obtain hemostasis. The rotator cuff was carefully inspected and demonstrated evidence of a partial-thickness flap tear of the bursal surface the rotator cuff involving the anterior insertional fibers of the supraspinatus  tendon with at least 50% involvement of the footprint thickness. The instruments were then removed from the subacromial space after suctioning the excess fluid.  An approximately 4-5 cm incision was made over the anterolateral aspect of the shoulder beginning at the anterolateral corner of the acromion and extending distally in line with the bicipital groove. This incision was carried down through the subcutaneous tissues to expose the deltoid fascia. The raphae between the anterior and middle thirds was identified and this plane developed to provide access into the subacromial space. Additional bursal tissues were debrided sharply using Metzenbaum scissors. The rotator cuff tear was readily identified. The margins were debrided sharply with a #15 blade and the exposed greater tuberosity roughened with a rongeur. The tear was repaired using a single Biomet 2.9 mm JuggerKnot anchor. These sutures were then brought back laterally and secured using a [employer] anchor to create a two-layer closure. An apparent watertight closure was obtained.  The wound was copiously irrigated with sterile saline solution before the deltoid raphae was reapproximated using 2-0 Vicryl interrupted sutures. The subcutaneous tissues were closed in two layers using 2-0 Vicryl interrupted sutures before the skin was closed using staples. The portal sites also were closed using staples. A sterile bulky dressing was applied to the shoulder before the arm was placed into a shoulder immobilizer. The patient was then awakened, extubated, and returned to the recovery room in satisfactory condition after tolerating the procedure well.

## 2016-03-30 NOTE — H&P (Signed)
Paper H&P to be scanned into permanent record. H&P reviewed. No changes. 

## 2016-03-30 NOTE — Telephone Encounter (Signed)
MyChart Rx Request:  Last Filled:     30 tablet 0 01/28/2016  Last office visit:   01/12/16  Please advise.

## 2016-03-30 NOTE — Anesthesia Procedure Notes (Signed)
Anesthesia Regional Block:  Interscalene brachial plexus block  Pre-Anesthetic Checklist: ,, timeout performed, Correct Patient, Correct Site, Correct Laterality, Correct Procedure, Correct Position, site marked, Risks and benefits discussed,  Surgical consent,  Pre-op evaluation,  At surgeon's request and post-op pain management  Laterality: Left  Prep: chloraprep       Needles:  Injection technique: Single-shot  Needle Type: Stimiplex     Needle Length: 5cm 5 cm Needle Gauge: 22 and 22 G    Additional Needles:  Procedures: ultrasound guided (picture in chart) Interscalene brachial plexus block Narrative:  Start time: 03/30/2016 11:05 AM End time: 03/30/2016 11:18 AM Injection made incrementally with aspirations every 5 mL.  Performed by: Personally  Anesthesiologist: Ajane Novella  Additional Notes: Functioning IV was confirmed and monitors were applied.  A 50mm 22ga Stimuplex needle was used. Sterile prep and drape,hand hygiene and sterile gloves were used.  Negative aspiration and negative test dose prior to incremental administration of local anesthetic. The patient tolerated the procedure well.

## 2016-03-31 ENCOUNTER — Encounter: Payer: Self-pay | Admitting: Surgery

## 2016-03-31 MED ORDER — ALPRAZOLAM 0.5 MG PO TABS: 0.5000 mg | ORAL_TABLET | Freq: Two times a day (BID) | ORAL | 0 refills | Status: DC | PRN

## 2016-03-31 NOTE — Telephone Encounter (Signed)
Please call in.  Thanks.   

## 2016-03-31 NOTE — Telephone Encounter (Signed)
Medication phoned to pharmacy.  

## 2016-07-19 ENCOUNTER — Other Ambulatory Visit: Payer: Self-pay | Admitting: Family Medicine

## 2016-07-20 NOTE — Telephone Encounter (Signed)
MyChart renewal request.  Last Filled:    30 tablet 0 03/31/2016  Last office visit:   01/12/16 acute [facility].  Please advise.

## 2016-07-21 MED ORDER — ALPRAZOLAM 0.5 MG PO TABS: 0.5000 mg | ORAL_TABLET | Freq: Two times a day (BID) | ORAL | 0 refills | Status: DC | PRN

## 2016-07-21 NOTE — Telephone Encounter (Signed)
[facility]

## 2016-07-21 NOTE — Telephone Encounter (Signed)
Please call in.  Thanks.   

## 2016-09-04 ENCOUNTER — Other Ambulatory Visit: Payer: Self-pay | Admitting: Family Medicine

## 2016-09-05 MED ORDER — ALPRAZOLAM 0.5 MG PO TABS: 0.5000 mg | ORAL_TABLET | Freq: Two times a day (BID) | ORAL | 0 refills | Status: DC | PRN

## 2016-09-05 NOTE — Telephone Encounter (Signed)
Rx called to pharmacy as instructed. 

## 2016-09-05 NOTE — Telephone Encounter (Signed)
Please call in.  Thanks.   

## 2016-10-29 ENCOUNTER — Other Ambulatory Visit: Payer: Self-pay | Admitting: Family Medicine

## 2016-10-29 MED ORDER — ALPRAZOLAM 0.5 MG PO TABS: 0.5000 mg | ORAL_TABLET | Freq: Two times a day (BID) | ORAL | 0 refills | Status: DC | PRN

## 2016-10-29 NOTE — Telephone Encounter (Signed)
Please call in.  Thanks.   

## 2016-10-29 NOTE — Telephone Encounter (Signed)
Last office visit 01/12/16 with Dr. Patsy Lageropland for shoulder pain. Last refilled 09/05/16 for #30 with no refills.  Ok to refill?

## 2016-10-30 NOTE — Telephone Encounter (Signed)
Rx called in to requested pharmacy 

## 2016-12-25 ENCOUNTER — Other Ambulatory Visit: Payer: Self-pay | Admitting: Family Medicine

## 2016-12-26 NOTE — Telephone Encounter (Signed)
MyChart Renewal.  Last office visit:   01/22/16 Acute with Dr. Patsy Lageropland. Last Filled:    30 tablet 0 10/29/2016  Please advise.

## 2016-12-27 MED ORDER — ALPRAZOLAM 0.5 MG PO TABS: 0.5000 mg | ORAL_TABLET | Freq: Two times a day (BID) | ORAL | 0 refills | Status: DC | PRN

## 2016-12-27 NOTE — Telephone Encounter (Signed)
Rx called in to requested pharmacy 

## 2016-12-27 NOTE — Telephone Encounter (Signed)
Please call in.  Thanks.   

## 2017-03-19 ENCOUNTER — Other Ambulatory Visit: Payer: Self-pay | Admitting: Family Medicine

## 2017-03-19 NOTE — Telephone Encounter (Signed)
Electronic refill request. Alprazolam Last office visit:   12/10/15 Last Filled:    30 tablet 0 12/27/2016  Please advise.

## 2017-03-20 MED ORDER — ALPRAZOLAM 0.5 MG PO TABS: 0.5000 mg | ORAL_TABLET | Freq: Two times a day (BID) | ORAL | 0 refills | Status: DC | PRN

## 2017-03-20 NOTE — Telephone Encounter (Signed)
Please call in.  Schedule office visit when possible. Thanks.

## 2017-03-20 NOTE — Telephone Encounter (Signed)
Patient advised to schedule OV.  Medication phoned to pharmacy.

## 2017-04-12 ENCOUNTER — Ambulatory Visit (INDEPENDENT_AMBULATORY_CARE_PROVIDER_SITE_OTHER): Payer: 59 | Admitting: Family Medicine

## 2017-04-12 ENCOUNTER — Encounter: Payer: Self-pay | Admitting: Family Medicine

## 2017-04-12 VITALS — BP 142/90 | HR 79 | Temp 98.6°F | Wt 332.0 lb

## 2017-04-12 DIAGNOSIS — F4321 Adjustment disorder with depressed mood: Secondary | ICD-10-CM

## 2017-04-12 DIAGNOSIS — R739 Hyperglycemia, unspecified: Secondary | ICD-10-CM

## 2017-04-12 DIAGNOSIS — F4320 Adjustment disorder, unspecified: Secondary | ICD-10-CM | POA: Diagnosis not present

## 2017-04-12 DIAGNOSIS — R0982 Postnasal drip: Secondary | ICD-10-CM

## 2017-04-12 MED ORDER — FLUTICASONE PROPIONATE 50 MCG/ACT NA SUSP: 2.0000 | Freq: Every day | NASAL | 6 refills | Status: DC
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
## 2017-04-12 NOTE — Patient Instructions (Addendum)
Try flonase and see if that helps. Add that to loratadine .  Update me as needed.  Take care.  Glad to see you.

## 2017-04-13 DIAGNOSIS — R0982 Postnasal drip: Secondary | ICD-10-CM | POA: Insufficient documentation

## 2017-04-13 NOTE — Assessment & Plan Note (Signed)
He is trying to gradually work through the situation. Discussed with patient. He does not use benzodiazepine daily. Uses intermittently. No adverse effect on medication. He is trying to "stretch" the prescription as long as he can. Continue as is. Update me as needed. The goal is for him to gradually need benzodiazepine less and less and gradually taper off. >25 minutes spent in face to face time with patient, >50% spent in counselling or coordination of care. Okay for outpatient f/u.

## 2017-04-13 NOTE — Assessment & Plan Note (Signed)
Has used Claritin previously. Can add on Flonase. Update me as needed. He agrees.

## 2017-04-13 NOTE — Assessment & Plan Note (Signed)
Noted on A1c. Labs discussed with patient. He is going to continue work on [employer] diet and exercise. Form done for work.

## 2017-04-17 ENCOUNTER — Encounter: Payer: Self-pay | Admitting: Family Medicine

## 2017-04-17 LAB — LAB REPORT - SCANNED
A1C: 6.2
A1c: 6.4
Cholesterol, Total: 175
Cholesterol, Total: 206
Creatinine, Ser: 0.73
Creatinine, Ser: 0.76
GLUCOSE: 113
Glucose: 111
HDL Cholesterol: 34
HDL: 35
LDL Cholesterol (Calc): 123
LDL Cholesterol (Calc): 141
TRIGLYCERIDES: 151
Triglycerides: 89

## 2017-06-19 ENCOUNTER — Other Ambulatory Visit: Payer: Self-pay | Admitting: Family Medicine

## 2017-06-20 MED ORDER — ALPRAZOLAM 0.5 MG PO TABS: 0.5000 mg | ORAL_TABLET | Freq: Two times a day (BID) | ORAL | 0 refills | Status: DC | PRN

## 2017-06-20 NOTE — Telephone Encounter (Signed)
Rx called to pharmacy as instructed. 

## 2017-06-20 NOTE — Telephone Encounter (Signed)
Please call in.  Thanks.   

## 2017-06-20 NOTE — Telephone Encounter (Signed)
Electronic refill Last refill 03/20/17 #30 Last office visit 04/12/17

## 2018-01-29 IMAGING — DX DG SHOULDER 2+V*L*
3 series · 4 of 4 positions shown · non-contrast
Comparison: No recent prior.

CLINICAL DATA: Pain left shoulder.  Fall.  Initial evaluation .

EXAM:
LEFT SHOULDER - 2+ VIEW

[Series 1: shoulder axial · 0.14mm/px · 2 of 2 slices shown]
[im 1/2]
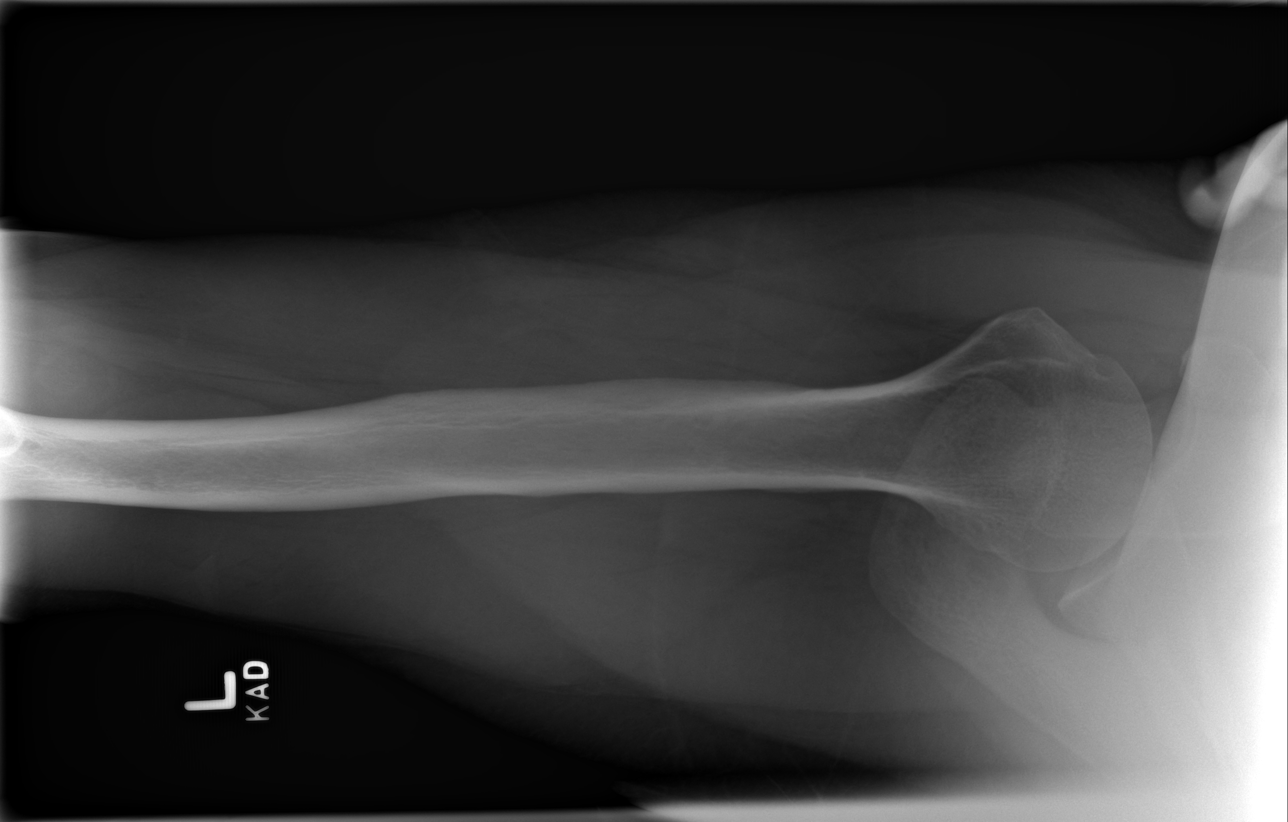
[im 2/2]
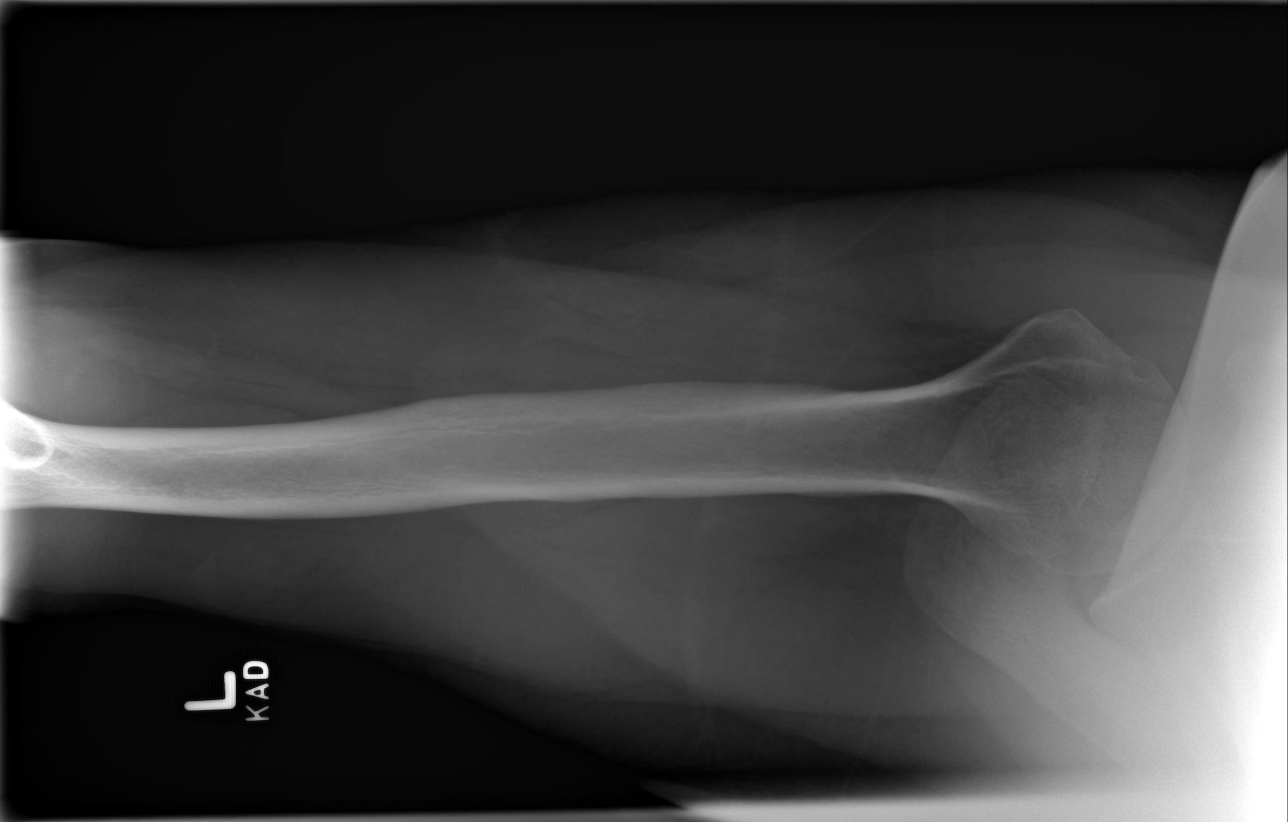

[shoulder ap]
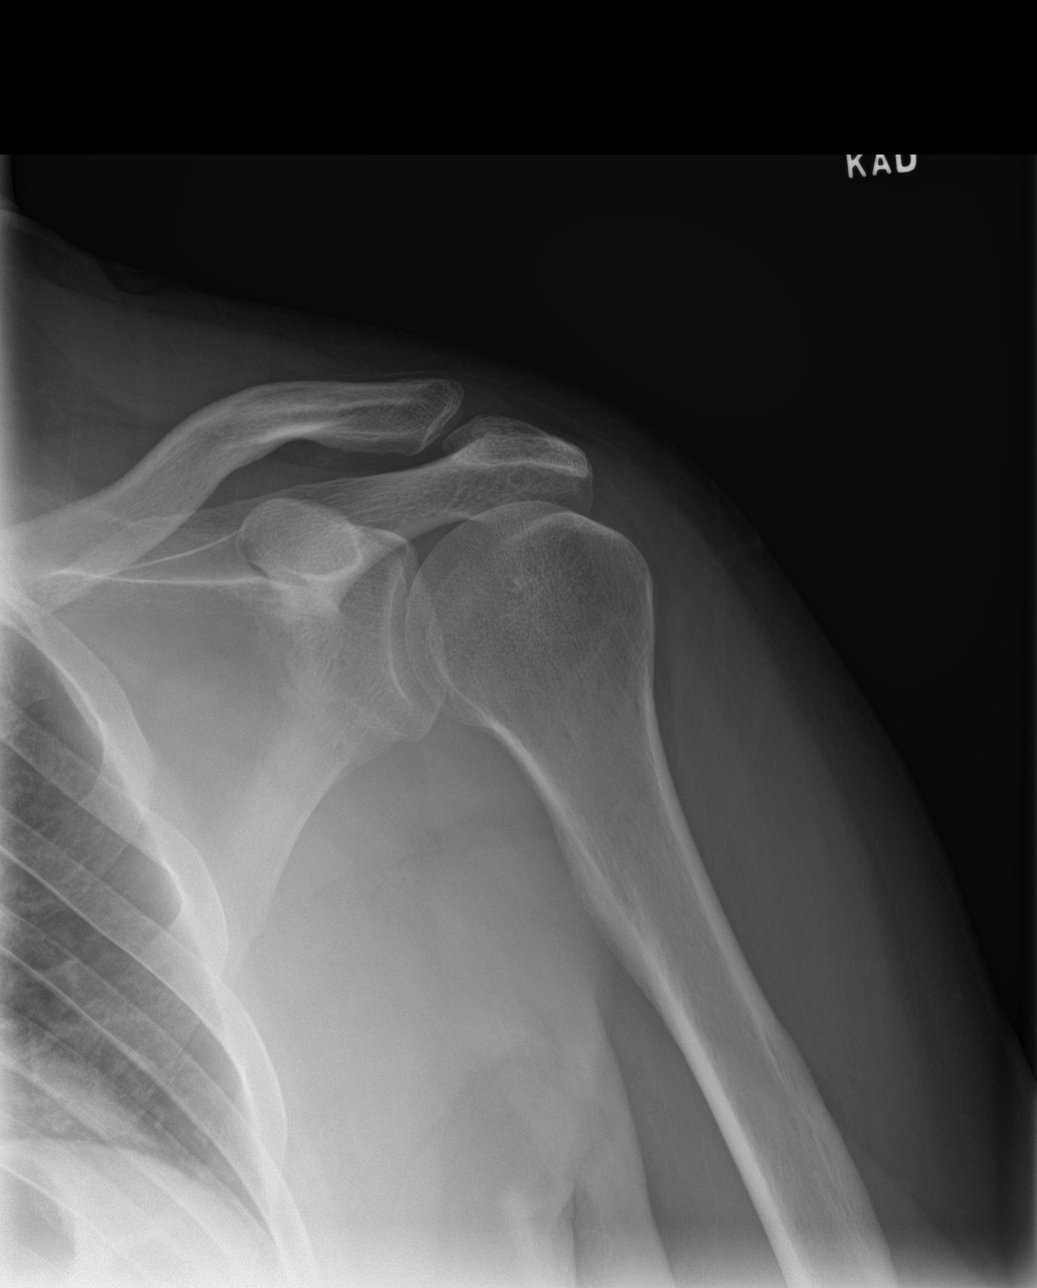

[shoulder y-view]
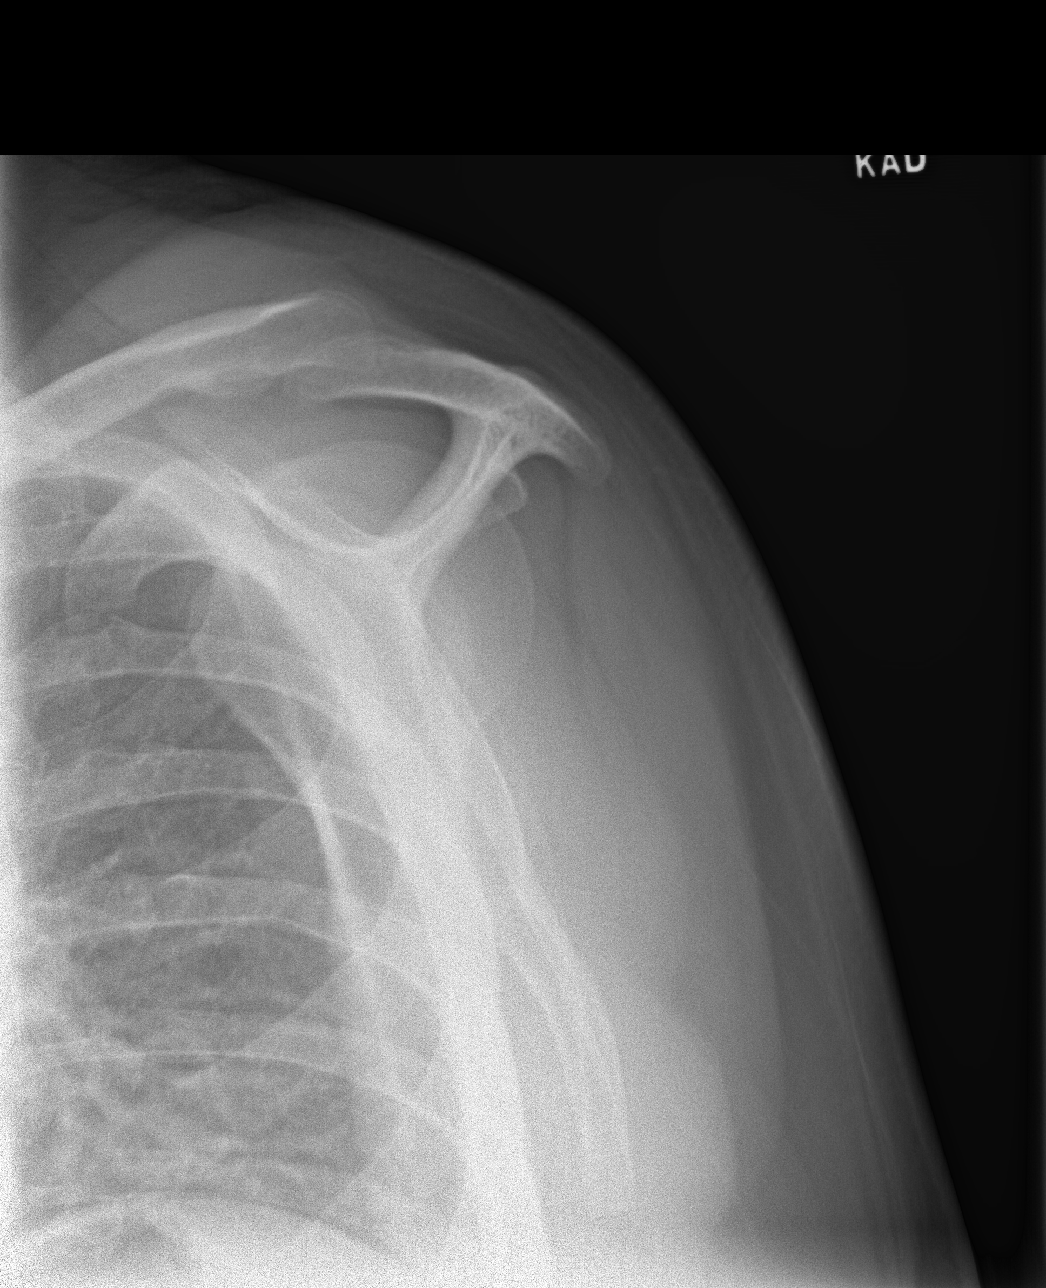

[4 of 4 positions shown; findings below may reference images not displayed]

FINDINGS: No acute bony or joint abnormality identified. No evidence of
fracture or dislocation.
IMPRESSION: No acute abnormality.

## 2018-05-14 ENCOUNTER — Ambulatory Visit (INDEPENDENT_AMBULATORY_CARE_PROVIDER_SITE_OTHER): Payer: Managed Care, Other (non HMO) | Admitting: Family Medicine

## 2018-05-14 ENCOUNTER — Encounter: Payer: Self-pay | Admitting: Family Medicine

## 2018-05-14 VITALS — BP 142/92 | HR 67 | Temp 98.6°F | Ht 75.0 in | Wt 300.2 lb

## 2018-05-14 DIAGNOSIS — Z7189 Other specified counseling: Secondary | ICD-10-CM

## 2018-05-14 DIAGNOSIS — Z0000 Encounter for general adult medical examination without abnormal findings: Secondary | ICD-10-CM | POA: Diagnosis not present

## 2018-05-14 DIAGNOSIS — Z23 Encounter for immunization: Secondary | ICD-10-CM | POA: Diagnosis not present

## 2018-05-14 DIAGNOSIS — F4321 Adjustment disorder with depressed mood: Secondary | ICD-10-CM

## 2018-05-14 DIAGNOSIS — R0982 Postnasal drip: Secondary | ICD-10-CM
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
## 2018-05-14 NOTE — Patient Instructions (Signed)
Flu shot at work when possible.  Keep working on diet and exercise.  Thanks for your effort.  MMR today.  Take care.  Glad to see you.  Update me as needed.   

## 2018-05-15 DIAGNOSIS — Z0000 Encounter for general adult medical examination without abnormal findings: Secondary | ICD-10-CM | POA: Insufficient documentation

## 2018-05-15 NOTE — Assessment & Plan Note (Signed)
He has been working through all of the upheaval and is doing better.  He will update me as needed.

## 2018-05-15 NOTE — Assessment & Plan Note (Signed)
Tetanus 2017 Flu to be done at work.   PNA not due.  Shingles not due.   Colon and prostate cancer screening not due.  Living will d/w pt.  Wife designated if patient were incapacitated.  HIV and HCV screening done ~[facility] at [facility].    Labs discussed with patient.  He is working on diet and exercise, d/w pt.  He has intentional weight loss.  Form done for work.    MMR vaccination d/w pt.  Reasonable to get repeat dose based on mumps Ab titer.

## 2018-05-15 NOTE — Assessment & Plan Note (Signed)
Living will d/w pt.  Wife designated if patient were incapacitated.   ?

## 2018-05-15 NOTE — Assessment & Plan Note (Signed)
Improved with allergy medications used at baseline.  Update me as needed.

## 2018-05-16 ENCOUNTER — Encounter: Payer: Self-pay | Admitting: Family Medicine

## 2018-05-16 LAB — LAB REPORT - SCANNED
A1C: 5.5
CHOLESTEROL: 192
Creatinine, Ser: 0.68
Glucose: 94
HDL: 37
LDL (calc): 123
MUMPS ABS, IGG: <9
RUBELLA: 2.75
RUBEOLA AB, IGG: 219
TRIGLYCERIDES: 160

## 2018-12-03 ENCOUNTER — Encounter: Payer: Self-pay | Admitting: Family Medicine

## 2018-12-03 NOTE — Telephone Encounter (Signed)
Last office visit 05/14/2018.  Not on current medication list.

## 2018-12-04 ENCOUNTER — Other Ambulatory Visit: Payer: Self-pay | Admitting: Family Medicine

## 2018-12-04 MED ORDER — ALPRAZOLAM 0.5 MG PO TABS: 0.5000 mg | ORAL_TABLET | Freq: Every day | ORAL | 1 refills | Status: DC | PRN

## 2019-10-10 ENCOUNTER — Ambulatory Visit: Payer: Managed Care, Other (non HMO) | Attending: Internal Medicine

## 2019-10-10 DIAGNOSIS — Z23 Encounter for immunization: Secondary | ICD-10-CM | POA: Insufficient documentation

## 2019-10-10 NOTE — Progress Notes (Signed)
   Covid-19 Vaccination Clinic  Name:  Roy Horton    MRN: [id number] DOB: 1976-07-04  10/10/2019  Roy Horton was observed post Covid-19 immunization for 15 minutes without incident. He was provided with Vaccine Information Sheet and instruction to access the V-Safe system.   Roy Horton was instructed to call 911 with any severe reactions post vaccine: Marland Kitchen Difficulty breathing  . Swelling of face and throat  . A fast heartbeat  . A bad rash all over body  . Dizziness and weakness   Immunizations Administered    Name Date Dose VIS Date Route   Pfizer COVID-19 Vaccine 10/10/2019  8:32 AM 0.3 mL 07/18/2019 Intramuscular   Manufacturer: [employer], [employer]   Lot: [id number]   NDC: [address]-6

## 2019-10-31 ENCOUNTER — Ambulatory Visit: Payer: Managed Care, Other (non HMO) | Attending: Internal Medicine

## 2019-10-31 DIAGNOSIS — Z23 Encounter for immunization: Secondary | ICD-10-CM

## 2019-10-31 NOTE — Progress Notes (Signed)
   Covid-19 Vaccination Clinic  Name:  Roy Horton    MRN: [id number] DOB: Jan 04, 1976  10/31/2019  Mr. Roy Horton was observed post Covid-19 immunization for 15 minutes without incident. He was provided with Vaccine Information Sheet and instruction to access the V-Safe system.   Mr. Roy Horton was instructed to call 911 with any severe reactions post vaccine: Marland Kitchen Difficulty breathing  . Swelling of face and throat  . A fast heartbeat  . A bad rash all over body  . Dizziness and weakness   Immunizations Administered    Name Date Dose VIS Date Route   Pfizer COVID-19 Vaccine 10/31/2019  8:11 AM 0.3 mL 07/18/2019 Intramuscular   Manufacturer: [employer], [employer]   Lot: [id number]   NDC: [address]-7

## 2020-01-08 ENCOUNTER — Encounter: Payer: Self-pay | Admitting: Family Medicine

## 2020-01-08 ENCOUNTER — Ambulatory Visit: Payer: Managed Care, Other (non HMO) | Admitting: Family Medicine

## 2020-01-08 ENCOUNTER — Other Ambulatory Visit: Payer: Self-pay

## 2020-01-08 VITALS — BP 170/102 | HR 81 | Temp 97.7°F | Ht 75.0 in | Wt 318.4 lb

## 2020-01-08 DIAGNOSIS — I10 Essential (primary) hypertension: Secondary | ICD-10-CM | POA: Diagnosis not present

## 2020-01-08 MED ORDER — LISINOPRIL 10 MG PO TABS: 10.0000 mg | ORAL_TABLET | Freq: Every day | ORAL | 3 refills | Status: DC

## 2020-01-08 NOTE — Progress Notes (Signed)
This visit occurred during the SARS-CoV-2 public health emergency.  Safety protocols were in place, including screening questions prior to the visit, additional usage of staff PPE, and extensive cleaning of exam room while observing appropriate contact time as indicated for disinfecting solutions.  He had his covid vaccine.    He had sig inc in work hours due to covid, d/w pt.    He went to eye clinic, noted BP elevation.  No vision loss.  His BP had been controlled in the past.  He checked BP at home, still with SBP ~155.   We talked about diet and weight and his fatigue related to work schedule.  He is aware of need for work on diet and exercise.  He is working a lot at work.  We talked about diet and limiting salt.    He doesn't smoke or drink.    No exertional CP, SOB, BLE edema.    Meds, vitals, and allergies reviewed.   ROS: Per HPI unless specifically indicated in ROS section   GEN: nad, alert and oriented HEENT: ncat NECK: supple w/o LA CV: rrr PULM: ctab, no inc wob ABD: soft, +bs EXT: no edema SKIN: no acute rash

## 2020-01-08 NOTE — Patient Instructions (Addendum)
Check DASH diet and [email or web address].   Start lisinopril 10mg  a day.  If BP is still >140/>90 1 week, then increase to 20mg  a day.  Update me about your BP in about 10 days, sooner if needed.  Drink enough water to keep your urine clear.  Limit ibuprofen and aleve.  Take care.  Glad to see you.

## 2020-01-09 LAB — BASIC METABOLIC PANEL
BUN/Creatinine Ratio: 16 (ref 9–20)
BUN: 12 mg/dL (ref 6–24)
CO2: 26 mmol/L (ref 20–29)
Calcium: 9.5 mg/dL (ref 8.7–10.2)
Chloride: 102 mmol/L (ref 96–106)
Creatinine, Ser: 0.73 mg/dL — ABNORMAL LOW (ref 0.76–1.27)
GFR calc Af Amer: 130 mL/min/{1.73_m2} (ref >59)
GFR calc non Af Amer: 112 mL/min/{1.73_m2} (ref >59)
Glucose: 127 mg/dL — ABNORMAL HIGH (ref 65–99)
Potassium: 4.5 mmol/L (ref 3.5–5.2)
Sodium: 142 mmol/L (ref 134–144)

## 2020-01-11 DIAGNOSIS — I10 Essential (primary) hypertension: Secondary | ICD-10-CM | POA: Insufficient documentation

## 2020-01-11 NOTE — Assessment & Plan Note (Signed)
Discussed options Advised him to look at [facility] diet and [email or web address].   Start lisinopril 10mg  a day.  If BP is still >140/>90 1 week, then increase to 20mg  a day.  He can update me about his BP in about 10 days, sooner if needed.  Drink enough water to keep your urine clear.  Limit ibuprofen and aleve.  He agrees with plan.

## 2020-04-27 ENCOUNTER — Ambulatory Visit: Payer: Managed Care, Other (non HMO) | Admitting: Family Medicine

## 2020-04-27 ENCOUNTER — Other Ambulatory Visit: Payer: Self-pay

## 2020-04-27 ENCOUNTER — Encounter: Payer: Self-pay | Admitting: Family Medicine

## 2020-04-27 DIAGNOSIS — I10 Essential (primary) hypertension: Secondary | ICD-10-CM | POA: Diagnosis not present

## 2020-04-27 NOTE — Progress Notes (Signed)
This visit occurred during the SARS-CoV-2 public health emergency.  Safety protocols were in place, including screening questions prior to the visit, additional usage of staff PPE, and extensive cleaning of exam room while observing appropriate contact time as indicated for disinfecting solutions.  He has labs from work.  D/w pt.  He had covid vaccine prev done.  Labs discussed with patient.  See hardcopy.  Taking 10mg  lisinopril and doing well with that with BP controlled.  Intentional weight loss.  Working on diet and exercise.  I thanked him for his effort.  Is BP is clearly controlled today.  BP controlled at home.  No CP, not SOB. Prev headaches resolved.    Flu shot to be done at work.    Lipids reasonable on labs at work.    Meds, vitals, and allergies reviewed.  ROS: Per HPI unless specifically indicated in ROS section   GEN: nad, alert and oriented HEENT: ncat NECK: supple w/o LA CV: rrr. PULM: ctab, no inc wob ABD: soft, +bs EXT: no edema SKIN: no acute rash

## 2020-04-27 NOTE — Patient Instructions (Signed)
If you get lightheaded, then cut back on lisinopril.  Update me as needed.  Thanks for your effort.

## 2020-04-28 NOTE — Assessment & Plan Note (Signed)
Continue work on diet and exercise.  Continue lisinopril as is for now.  If he gets lightheaded then he can cut back on the dose.  He will update me as needed.  He agrees.  I thanked him for his effort.

## 2020-05-28 ENCOUNTER — Encounter: Payer: Self-pay | Admitting: Family Medicine

## 2020-05-28 LAB — LAB REPORT - SCANNED
A1c: 6.2
Cholesterol: 151
Creatinine, Ser: 0.78
Glucose: 113
HDL: 35
LDL (calc): 99
Triglycerides: 90 (ref 40–160)

## 2020-11-30 ENCOUNTER — Ambulatory Visit: Payer: Managed Care, Other (non HMO) | Admitting: Cardiology

## 2020-11-30 ENCOUNTER — Encounter: Payer: Self-pay | Admitting: Cardiology

## 2020-11-30 ENCOUNTER — Ambulatory Visit (INDEPENDENT_AMBULATORY_CARE_PROVIDER_SITE_OTHER): Payer: Managed Care, Other (non HMO)

## 2020-11-30 ENCOUNTER — Other Ambulatory Visit: Payer: Self-pay

## 2020-11-30 VITALS — BP 136/84 | HR 75 | Ht 75.0 in | Wt 294.0 lb

## 2020-11-30 DIAGNOSIS — R002 Palpitations: Secondary | ICD-10-CM | POA: Diagnosis not present

## 2020-11-30 DIAGNOSIS — I10 Essential (primary) hypertension: Secondary | ICD-10-CM

## 2020-11-30 NOTE — Progress Notes (Signed)
Cardiology Office Note:    Date:  11/30/2020   ID:  Roy Horton, DOB 1976-06-30, MRN [id number]  PCP:  Joaquim Nam, MD   [facility]  Cardiologist:  Debbe Odea, MD  Advanced Practice Provider:  No care team member to display Electrophysiologist:  None       Referring MD: Joaquim Nam, MD   Chief Complaint  Patient presents with  . New Patient (Initial Visit)    Self referral. Patient c.o palpitations - started good Friday with a HR of 160 for about 10 hrs. Meds reviewed verbally with patient.    Roy Horton is a 45 y.o. male who is being seen today for the evaluation of palpitations at the request of Para March, Dwana Curd, MD.   History of Present Illness:    Roy Horton is a 45 y.o. male with a hx of anxiety, hypertension who presents due to palpitations.  Patient complains of palpitations worsening over the past month.  Symptoms are lower associated with exertion.  About 2 weeks ago while he was sitting, he notices heart rates go from 60 to about 160s.  Symptoms lasted a couple of minutes.  He drove to the emergency department, while in the parking lot, his symptoms resolved with heart rates currently back down to normal limits.  Symptoms have occurred about 2 times over the past weeks.  Denies any history of heart disease, denies drinking alcohol, does not smoke.  Only drinks tea occasionally.  Had similar symptoms about 5 years ago after a his mom passed.  Past Medical History:  Diagnosis Date  . Anxiety   . GERD (gastroesophageal reflux disease)    OCC  . History of concussion   . Hyperglycemia   . Migraines    improved with diet changes (cutting out MSG)  . Plantar fascia syndrome    s/p injection    Past Surgical History:  Procedure Laterality Date  . SHOULDER ARTHROSCOPY WITH OPEN ROTATOR CUFF REPAIR Left 03/30/2016   Procedure: SHOULDER ARTHROSCOPY WITH OPEN ROTATOR CUFF PARTIAL THICKNESS REPAIR;  Surgeon: Christena Flake, MD;  Location: [facility] ORS;  Service: Orthopedics;  Laterality: Left;  . SHOULDER ARTHROSCOPY WITH SUBACROMIAL DECOMPRESSION Left 03/30/2016   Procedure: SHOULDER ARTHROSCOPY WITH SUBACROMIAL DECOMPRESSION AND DEBRIDEMENT;  Surgeon: Christena Flake, MD;  Location: [facility] ORS;  Service: Orthopedics;  Laterality: Left;  . WISDOM TOOTH EXTRACTION      Current Medications: Current Meds  Medication Sig  . ALPRAZolam (XANAX) 0.5 MG tablet Take 1 tablet (0.5 mg total) by mouth daily as needed for anxiety (sedation caution).  Marland Kitchen lisinopril (ZESTRIL) 10 MG tablet Take 1-2 tablets (10-20 mg total) by mouth daily.     Allergies:   Avocado   Social History   Socioeconomic History  . Marital status: Married    Spouse name: Not on file  . Number of children: Not on file  . Years of education: Not on file  . Highest education level: Not on file  Occupational History  . Not on file  Tobacco Use  . Smoking status: Never Smoker  . Smokeless tobacco: Never Used  Substance and Sexual Activity  . Alcohol use: No    Alcohol/week: 0.0 standard drinks  . Drug use: No  . Sexual activity: Not on file  Other Topics Concern  . Not on file  Social History Narrative   Married 2001   Works at [employer]  Financial Resource Strain: Not on file  Food Insecurity: Not on file  Transportation Needs: Not on file  Physical Activity: Not on file  Stress: Not on file  Social Connections: Not on file     Family History: The patient's family history includes Heart disease in his father. There is no history of Colon cancer or Prostate cancer.  ROS:   Please see the history of present illness.     All other systems reviewed and are negative.  EKGs/Labs/Other Studies Reviewed:    The following studies were reviewed today:   EKG:  EKG is  ordered today.  The ekg ordered today demonstrates sinus rhythm, normal EKG.  Recent Labs: 01/08/2020: BUN 12; Potassium 4.5;  Sodium 142 03/11/2020: Creatinine, Ser 0.78  Recent Lipid Panel    Component Value Date/Time   CHOL 151 03/11/2020 0000   CHOL 175 03/09/2017 0000   TRIG 90 03/11/2020 0000   HDL 34 03/09/2017 0000   LDLCALC 99 03/11/2020 0000     Risk Assessment/Calculations:      Physical Exam:    VS:  BP 136/84 (BP Location: Left Arm, Patient Position: Sitting, Cuff Size: Normal)   Pulse 75   Ht 6\' 3"  (1.905 m)   Wt 294 lb (133.4 kg)   SpO2 99%   BMI 36.75 kg/m     Wt Readings from Last 3 Encounters:  11/30/20 294 lb (133.4 kg)  04/27/20 291 lb 2 oz (132.1 kg)  01/08/20 (!) 318 lb 6 oz (144.4 kg)     GEN:  Well nourished, well developed in no acute distress HEENT: Normal NECK: No JVD; No carotid bruits LYMPHATICS: No lymphadenopathy CARDIAC: RRR, no murmurs, rubs, gallops RESPIRATORY:  Clear to auscultation without rales, wheezing or rhonchi  ABDOMEN: Soft, non-tender, non-distended MUSCULOSKELETAL:  No edema; No deformity  SKIN: Warm and dry NEUROLOGIC:  Alert and oriented x 3 PSYCHIATRIC:  Normal affect   ASSESSMENT:    1. Palpitations   2. Primary hypertension    PLAN:    In order of problems listed above:  1. Palpitations, likely atrial arrhythmia such as SVT or a flutter/fib.  Place cardiac monitor to evaluate presence of arrhythmia. 2. Hypertension, continue lisinopril.  Follow-up after cardiac monitor.     Medication Adjustments/Labs and Tests Ordered: Current medicines are reviewed at length with the patient today.  Concerns regarding medicines are outlined above.  Orders Placed This Encounter  Procedures  . LONG TERM MONITOR (3-14 DAYS)  . EKG 12-Lead   No orders of the defined types were placed in this encounter.   Patient Instructions  Medication Instructions:  Your physician recommends that you continue on your current medications as directed. Please refer to the Current Medication list given to you today.  *If you need a refill on your cardiac  medications before your next appointment, please call your pharmacy*   Lab Work: None ordered If you have labs (blood work) drawn today and your tests are completely normal, you will receive your results only by: 03/09/20 MyChart Message (if you have MyChart) OR . A paper copy in the mail If you have any lab test that is abnormal or we need to change your treatment, we will call you to review the results.   Testing/Procedures:  1.  Your physician has recommended that you wear a Zio monitor.   This monitor is a medical device that records the heart's electrical activity. Doctors most often use these monitors to diagnose arrhythmias. Arrhythmias are problems with  the speed or rhythm of the heartbeat. The monitor is a small device applied to your chest. You can wear one while you do your normal daily activities. While wearing this monitor if you have any symptoms to push the button and record what you felt. Once you have worn this monitor for the period of time provider prescribed (Usually 14 days), you will return the monitor device in the postage paid box. Once it is returned they will download the data collected and provide [place] with a report which the provider will then review and we will call you with those results. Important tips:  1. Avoid showering during the first 24 hours of wearing the monitor. 2. Avoid excessive sweating to help maximize wear time. 3. Do not submerge the device, no hot tubs, and no swimming pools. 4. Keep any lotions or oils away from the patch. 5. After 24 hours you may shower with the patch on. Take brief showers with your back facing the shower head.  6. Do not remove patch once it has been placed because that will interrupt data and decrease adhesive wear time. 7. Push the button when you have any symptoms and write down what you were feeling. 8. Once you have completed wearing your monitor, remove and place into box which has postage paid and place in your outgoing  mailbox.  9. If for some reason you have misplaced your box then call our office and we can provide another box and/or mail it off for you.       Follow-Up: At [facility], you and your health needs are our priority.  As part of our continuing mission to provide you with exceptional heart care, we have created designated Provider Care Teams.  These Care Teams include your primary Cardiologist (physician) and Advanced Practice Providers (APPs -  Physician Assistants and Nurse Practitioners) who all work together to provide you with the care you need, when you need it.  We recommend signing up for the patient portal called "MyChart".  Sign up information is provided on this After Visit Summary.  MyChart is used to connect with patients for Virtual Visits (Telemedicine).  Patients are able to view lab/test results, encounter notes, upcoming appointments, etc.  Non-urgent messages can be sent to your provider as well.   To learn more about what you can do with MyChart, go to [email or web address].    Your next appointment:   6 week(s)  The format for your next appointment:   In Person  Provider:   Debbe Odea, MD   Other Instructions      Signed, Debbe Odea, MD  11/30/2020 10:08 AM    [facility]

## 2020-11-30 NOTE — Patient Instructions (Signed)
Medication Instructions:  Your physician recommends that you continue on your current medications as directed. Please refer to the Current Medication list given to you today.  *If you need a refill on your cardiac medications before your next appointment, please call your pharmacy*   Lab Work: None ordered If you have labs (blood work) drawn today and your tests are completely normal, you will receive your results only by: . MyChart Message (if you have MyChart) OR . A paper copy in the mail If you have any lab test that is abnormal or we need to change your treatment, we will call you to review the results.   Testing/Procedures:  1.  Your physician has recommended that you wear a Zio monitor. This monitor is a medical device that records the heart's electrical activity. Doctors most often use these monitors to diagnose arrhythmias. Arrhythmias are problems with the speed or rhythm of the heartbeat. The monitor is a small device applied to your chest. You can wear one while you do your normal daily activities. While wearing this monitor if you have any symptoms to push the button and record what you felt. Once you have worn this monitor for the period of time provider prescribed (Usually 14 days), you will return the monitor device in the postage paid box. Once it is returned they will download the data collected and provide us with a report which the provider will then review and we will call you with those results. Important tips:  1. Avoid showering during the first 24 hours of wearing the monitor. 2. Avoid excessive sweating to help maximize wear time. 3. Do not submerge the device, no hot tubs, and no swimming pools. 4. Keep any lotions or oils away from the patch. 5. After 24 hours you may shower with the patch on. Take brief showers with your back facing the shower head.  6. Do not remove patch once it has been placed because that will interrupt data and decrease adhesive wear  time. 7. Push the button when you have any symptoms and write down what you were feeling. 8. Once you have completed wearing your monitor, remove and place into box which has postage paid and place in your outgoing mailbox.  9. If for some reason you have misplaced your box then call our office and we can provide another box and/or mail it off for you.         Follow-Up: At [facility], you and your health needs are our priority.  As part of our continuing mission to provide you with exceptional heart care, we have created designated Provider Care Teams.  These Care Teams include your primary Cardiologist (physician) and Advanced Practice Providers (APPs -  Physician Assistants and Nurse Practitioners) who all work together to provide you with the care you need, when you need it.  We recommend signing up for the patient portal called "MyChart".  Sign up information is provided on this After Visit Summary.  MyChart is used to connect with patients for Virtual Visits (Telemedicine).  Patients are able to view lab/test results, encounter notes, upcoming appointments, etc.  Non-urgent messages can be sent to your provider as well.   To learn more about what you can do with MyChart, go to [email or web address].    Your next appointment:   6 weeks   The format for your next appointment:   In Person  Provider:   Brian Agbor-Etang, MD   Other Instructions   

## 2020-12-17 ENCOUNTER — Telehealth: Payer: Self-pay

## 2020-12-17 ENCOUNTER — Encounter: Payer: Self-pay | Admitting: Family Medicine

## 2020-12-17 DIAGNOSIS — I471 Supraventricular tachycardia: Secondary | ICD-10-CM

## 2020-12-17 NOTE — Telephone Encounter (Signed)
Last filled 12/04/2018 Last OV 04/27/2020  Ok to fill?

## 2020-12-17 NOTE — Telephone Encounter (Signed)
Called and left a detailed message for patient per DPR on file with the following orders from Dr. Azucena Cecil in reference to the EKG report sent in by patient through his MyChart. Encouraged patient to call back with any questions or concerns.  EKG shows SVT, cannot rule out underlying atrial flutter. Refer patient to electrophysiology. Also please obtain echocardiogram. Keep follow-up appointment.

## 2020-12-17 NOTE — Telephone Encounter (Signed)
LMOV to schedule  

## 2020-12-19 ENCOUNTER — Other Ambulatory Visit: Payer: Self-pay | Admitting: Family Medicine

## 2020-12-19 MED ORDER — ALPRAZOLAM 0.5 MG PO TABS: 0.5000 mg | ORAL_TABLET | Freq: Every day | ORAL | 0 refills | Status: DC | PRN

## 2020-12-22 ENCOUNTER — Telehealth: Payer: Self-pay

## 2020-12-22 MED ORDER — METOPROLOL SUCCINATE ER 25 MG PO TB24: 25.0000 mg | ORAL_TABLET | Freq: Every day | ORAL | 5 refills | Status: DC

## 2020-12-22 NOTE — Telephone Encounter (Signed)
-----   Message from Debbe Odea, MD sent at 12/21/2020 11:36 AM EDT ----- Paroxysmal SVTs noted.  Get echocardiogram as scheduled.  Keep appointment with electrophysiology.  Start Toprol-XL 25 mg daily.  Thank you

## 2020-12-22 NOTE — Telephone Encounter (Signed)
Patient is returning your call.  

## 2020-12-22 NOTE — Telephone Encounter (Signed)
Called patient and answered his questions regarding the Toprol XL. Patient was grateful for the call back.

## 2020-12-22 NOTE — Telephone Encounter (Signed)
Called and left a detailed VM per DPR. Sent new prescription into patients pharmacy on file. Encouraged patient to call back with any questions or concerns.

## 2021-01-05 ENCOUNTER — Ambulatory Visit: Payer: Managed Care, Other (non HMO) | Admitting: Cardiology

## 2021-01-05 ENCOUNTER — Other Ambulatory Visit: Payer: Self-pay

## 2021-01-05 ENCOUNTER — Encounter: Payer: Self-pay | Admitting: Cardiology

## 2021-01-05 VITALS — BP 142/86 | HR 72 | Ht 75.0 in | Wt 304.0 lb

## 2021-01-05 DIAGNOSIS — I471 Supraventricular tachycardia: Secondary | ICD-10-CM | POA: Diagnosis not present

## 2021-01-05 DIAGNOSIS — R400 Somnolence: Secondary | ICD-10-CM

## 2021-01-05 DIAGNOSIS — G4730 Sleep apnea, unspecified: Secondary | ICD-10-CM | POA: Diagnosis not present

## 2021-01-05 NOTE — Progress Notes (Signed)
Electrophysiology Office Note:    Date:  01/05/2021   ID:  Roy Horton, DOB Dec 28, 1975, MRN [id number]  PCP:  Joaquim Nam, MD  [facility] Cardiologist:  Debbe Odea, MD  [facility] Electrophysiologist:  Lanier Prude, MD   Referring MD: Debbe Odea, MD   Chief Complaint: SVT  History of Present Illness:    Roy Horton is a 45 y.o. male who presents for an evaluation of SVT at the request of Dr. Azucena Cecil. Their medical history includes anxiety, GERD, obesity.  The patient last saw Dr. Azucena Cecil November 30, 2020 for palpitations.  About 2 weeks prior to that appointment he reported having heart rate of 160 bpm.  The symptoms lasted for several minutes before he drove to the emergency department.  While he was in the emergency department parking lot, his tachycardia resolved.  He had several other episodes of these rapid heartbeats.  No known history of atrial fibrillation or flutter.  At that appointment, a Zio patch was ordered.  Today he tells me he has had several episodes of this tachycardia that have lasted for hours.  He has had an episode that lasted for greater than 10 hours.  After the episode he feels wiped out and fatigued.  No syncope or presyncope.  No clear triggers.  He had another episode in the remote past around the time that his mother passed away.  Has taken Xanax for some of the episodes which seem to help.  He is with his wife today in clinic.  She tells me that at night he gasps for air.  He has daytime sleepiness.  He has increased neck circumference.  He has never been assessed for sleep apnea.  He sometimes snores.   Past Medical History:  Diagnosis Date  . Anxiety   . GERD (gastroesophageal reflux disease)    OCC  . History of concussion   . Hyperglycemia   . Migraines    improved with diet changes (cutting out MSG)  . Plantar fascia syndrome    s/p injection    Past Surgical History:  Procedure Laterality Date   . SHOULDER ARTHROSCOPY WITH OPEN ROTATOR CUFF REPAIR Left 03/30/2016   Procedure: SHOULDER ARTHROSCOPY WITH OPEN ROTATOR CUFF PARTIAL THICKNESS REPAIR;  Surgeon: Christena Flake, MD;  Location: [facility] ORS;  Service: Orthopedics;  Laterality: Left;  . SHOULDER ARTHROSCOPY WITH SUBACROMIAL DECOMPRESSION Left 03/30/2016   Procedure: SHOULDER ARTHROSCOPY WITH SUBACROMIAL DECOMPRESSION AND DEBRIDEMENT;  Surgeon: Christena Flake, MD;  Location: [facility] ORS;  Service: Orthopedics;  Laterality: Left;  . WISDOM TOOTH EXTRACTION      Current Medications: Current Meds  Medication Sig  . ALPRAZolam (XANAX) 0.5 MG tablet Take 1 tablet (0.5 mg total) by mouth daily as needed for anxiety (sedation caution).  Marland Kitchen lisinopril (ZESTRIL) 10 MG tablet Take 1-2 tablets (10-20 mg total) by mouth daily.  . metoprolol succinate (TOPROL XL) 25 MG 24 hr tablet Take 1 tablet (25 mg total) by mouth daily.     Allergies:   Avocado   Social History   Socioeconomic History  . Marital status: Married    Spouse name: Not on file  . Number of children: Not on file  . Years of education: Not on file  . Highest education level: Not on file  Occupational History  . Not on file  Tobacco Use  . Smoking status: Never Smoker  . Smokeless tobacco: Never Used  Substance and Sexual Activity  . Alcohol use: No  Alcohol/week: 0.0 standard drinks  . Drug use: No  . Sexual activity: Not on file  Other Topics Concern  . Not on file  Social History Narrative   Married 2001   Works at [employer], Occupational hygienist of Corporate investment banker Strain: Not on [employer] Insecurity: Not on file  Transportation Needs: Not on file  Physical Activity: Not on file  Stress: Not on file  Social Connections: Not on file     Family History: The patient's family history includes Heart disease in his father. There is no history of Colon cancer or Prostate cancer.  ROS:   Please see the history of present illness.    All  other systems reviewed and are negative.  EKGs/Labs/Other Studies Reviewed:    The following studies were reviewed today:  Dec 20, 2020 Zio patch personally reviewed Heart rate 45-1 97 20 episodes of SVT, longest lasting 11.4 seconds     EKG:  The ekg ordered today demonstrates sinus rhythm.  No evidence of preexcitation.  Recent Labs: 01/08/2020: BUN 12; Potassium 4.5; Sodium 142 03/11/2020: Creatinine, Ser 0.78  Recent Lipid Panel    Component Value Date/Time   CHOL 151 03/11/2020 0000   CHOL 175 03/09/2017 0000   TRIG 90 03/11/2020 0000   HDL 34 03/09/2017 0000   LDLCALC 99 03/11/2020 0000    Physical Exam:    VS:  BP (!) 142/86   Pulse 72   Ht 6\' 3"  (1.905 m)   Wt (!) 304 lb (137.9 kg)   BMI 38.00 kg/m     Wt Readings from Last 3 Encounters:  01/05/21 (!) 304 lb (137.9 kg)  11/30/20 294 lb (133.4 kg)  04/27/20 291 lb 2 oz (132.1 kg)     GEN:  Well nourished, well developed in no acute distress.  Obese HEENT: Normal NECK: No JVD; No carotid bruits LYMPHATICS: No lymphadenopathy CARDIAC: RRR, no murmurs, rubs, gallops RESPIRATORY:  Clear to auscultation without rales, wheezing or rhonchi  ABDOMEN: Soft, non-tender, non-distended MUSCULOSKELETAL:  No edema; No deformity  SKIN: Warm and dry NEUROLOGIC:  Alert and oriented x 3 PSYCHIATRIC:  Normal affect   ASSESSMENT:    1. SVT (supraventricular tachycardia) (HCC)   2. Daytime sleepiness   3. Sleep apnea, unspecified type    PLAN:    In order of problems listed above:  1. SVT Recurrent episodes with some lasting greater than 10 hours.  Symptomatic with palpitations and fatigue.  He continues to have episodes despite treatment with Toprol-XL.  Review of his ZIO monitor shows several nonsustained episodes.  He tells me that he did not have any of his long episodes while wearing the monitor.  The tracings from the ZIO monitor suggest a narrow complex tachycardia that is regular.  Differential would include  AVNRT, AT, a flutter or ORT.  I discussed the management options with the patient and his wife during today's visit including continued conservative management with metoprolol or EP study with possible ablation.  I discussed the risks, expected recovery time and expected efficacy depending on what type of arrhythmia is discovered during the EP study.  We discussed the possibility that an arrhythmia would not be inducible.  We discussed the possibility that arrhythmias could be discovered during the EP study that would not be targeted with ablation (atrial fibrillation).  He would like to proceed with scheduling an EP study with possible ablation.  He will hold his metoprolol for 1 week prior  to the procedure.  Risk, benefits, and alternatives to EP study and radiofrequency ablation for SVT were also discussed in detail today. These risks include but are not limited to complete heart block, stroke, bleeding, vascular damage, tamponade, perforation, and death. The patient understands these risk and wishes to proceed.  We will therefore proceed with catheter ablation at the next available time.  Carto and ICE are requested for the procedure.   2.  Possible OSA Daytime sleepiness, snoring, nighttime apnea, increased neck circumference. Ordered at home sleep study.  3.  Obesity  Total time spent with patient today 65 minutes. This includes reviewing records, evaluating the patient and coordinating care.  Medication Adjustments/Labs and Tests Ordered: Current medicines are reviewed at length with the patient today.  Concerns regarding medicines are outlined above.  Orders Placed This Encounter  Procedures  . Basic Metabolic Panel (BMET)  . CBC w/Diff  . EKG 12-Lead  . Itamar Sleep Study   No orders of the defined types were placed in this encounter.    Signed, Rossie Muskrat. Lalla Brothers, MD, [facility], [facility] 01/05/2021 7:49 PM    Electrophysiology [facility]

## 2021-01-05 NOTE — Patient Instructions (Addendum)
Medication Instructions:  Your physician recommends that you continue on your current medications as directed. Please refer to the Current Medication list given to you today. *If you need a refill on your cardiac medications before your next appointment, please call your pharmacy*  Lab Work: You will get lab work today:  CBC and BMP  If you have labs (blood work) drawn today and your tests are completely normal, you will receive your results only by: Marland Kitchen MyChart Message (if you have MyChart) OR . A paper copy in the mail If you have any lab test that is abnormal or we need to change your treatment, we will call you to review the results.  Testing/Procedures: Your physician has recommended that you have a sleep study. This test records several body functions during sleep, including: brain activity, eye movement, oxygen and carbon dioxide blood levels, heart rate and rhythm, breathing rate and rhythm, the flow of air through your mouth and nose, snoring, body muscle movements, and chest and belly movement.  Your physician has recommended that you have an ablation. Catheter ablation is a medical procedure used to treat some cardiac arrhythmias (irregular heartbeats). During catheter ablation, a long, thin, flexible tube is put into a blood vessel in your groin (upper thigh), or neck. This tube is called an ablation catheter. It is then guided to your heart through the blood vessel. Radio frequency waves destroy small areas of heart tissue where abnormal heartbeats may cause an arrhythmia to start. Please see the instruction sheet given to you today.   Follow-Up: At [facility], you and your health needs are our priority.  As part of our continuing mission to provide you with exceptional heart care, we have created designated Provider Care Teams.  These Care Teams include your primary Cardiologist (physician) and Advanced Practice Providers (APPs -  Physician Assistants and Nurse Practitioners) who  all work together to provide you with the care you need, when you need it.  Your next appointment:    SEE INSTRUCTION LETTER   Cardiac electrophysiology: From cell to bedside (7th ed., pp. 5784-6962). [place], [place]: Elsevier.">  Cardiac Ablation Cardiac ablation is a procedure to destroy, or ablate, a small amount of heart tissue in very specific places. The heart has many electrical connections. Sometimes these connections are abnormal and can cause the heart to beat very fast or irregularly. Ablating some of the areas that cause problems can improve the heart's rhythm or return it to normal. Ablation may be done for people who:  Have Wolff-Parkinson-White syndrome.  Have fast heart rhythms (tachycardia).  Have taken medicines for an abnormal heart rhythm (arrhythmia) that were not effective or caused side effects.  Have a high-risk heartbeat that may be life-threatening. During the procedure, a small incision is made in the neck or the groin, and a long, thin tube (catheter) is inserted into the incision and moved to the heart. Small devices (electrodes) on the tip of the catheter will send out electrical currents. A type of X-ray (fluoroscopy) will be used to help guide the catheter and to provide images of the heart. Tell a health care provider about:  Any allergies you have.  All medicines you are taking, including vitamins, herbs, eye drops, creams, and over-the-counter medicines.  Any problems you or family members have had with anesthetic medicines.  Any blood disorders you have.  Any surgeries you have had.  Any medical conditions you have, such as kidney failure.  Whether you are pregnant or may be  pregnant. What are the risks? Generally, this is a safe procedure. However, problems may occur, including:  Infection.  Bruising and bleeding at the catheter insertion site.  Bleeding into the chest, especially into the sac that surrounds the heart. This is a serious  complication.  Stroke or blood clots.  Damage to nearby structures or organs.  Allergic reaction to medicines or dyes.  Need for a permanent pacemaker if the normal electrical system is damaged. A pacemaker is a small computer that sends electrical signals to the heart and helps your heart beat normally.  The procedure not being fully effective. This may not be recognized until months later. Repeat ablation procedures are sometimes done. What happens before the procedure? Medicines Ask your health care provider about:  Changing or stopping your regular medicines. This is especially important if you are taking diabetes medicines or blood thinners.  Taking medicines such as aspirin and ibuprofen. These medicines can thin your blood. Do not take these medicines unless your health care provider tells you to take them.  Taking over-the-counter medicines, vitamins, herbs, and supplements. General instructions  Follow instructions from your health care provider about eating or drinking restrictions.  Plan to have someone take you home from the hospital or clinic.  If you will be going home right after the procedure, plan to have someone with you for 24 hours.  Ask your health care provider what steps will be taken to prevent infection. What happens during the procedure?  An IV will be inserted into one of your veins.  You will be given a medicine to help you relax (sedative).  The skin on your neck or groin will be numbed.  An incision will be made in your neck or your groin.  A needle will be inserted through the incision and into a large vein in your neck or groin.  A catheter will be inserted into the needle and moved to your heart.  Dye may be injected through the catheter to help your surgeon see the area of the heart that needs treatment.  Electrical currents will be sent from the catheter to ablate heart tissue in desired areas. There are three types of energy that may be  used to do this: ? Heat (radiofrequency energy). ? Laser energy. ? Extreme cold (cryoablation).  When the tissue has been ablated, the catheter will be removed.  Pressure will be held on the insertion area to prevent a lot of bleeding.  A bandage (dressing) will be placed over the insertion area. The exact procedure may vary among health care providers and hospitals.   What happens after the procedure?  Your blood pressure, heart rate, breathing rate, and blood oxygen level will be monitored until you leave the hospital or clinic.  Your insertion area will be monitored for bleeding. You will need to lie still for a few hours to ensure that you do not bleed from the insertion area.  Do not drive for 24 hours or as long as told by your health care provider. Summary  Cardiac ablation is a procedure to destroy, or ablate, a small amount of heart tissue using an electrical current. This procedure can improve the heart rhythm or return it to normal.  Tell your health care provider about any medical conditions you may have and all medicines you are taking to treat them.  This is a safe procedure, but problems may occur. Problems may include infection, bruising, damage to nearby organs or structures, or allergic reactions to  medicines.  Follow your health care provider's instructions about eating and drinking before the procedure. You may also be told to change or stop some of your medicines.  After the procedure, do not drive for 24 hours or as long as told by your health care provider. This information is not intended to replace advice given to you by your health care provider. Make sure you discuss any questions you have with your health care provider. Document Revised: 06/02/2019 Document Reviewed: 06/02/2019 Elsevier Patient Education  2021 [employer].

## 2021-01-05 NOTE — H&P (View-Only) (Signed)
Electrophysiology Office Note:    Date:  01/05/2021   ID:  Roy Horton, DOB Dec 28, 1975, MRN [id number]  PCP:  Joaquim Nam, MD  [facility] Cardiologist:  Debbe Odea, MD  [facility] Electrophysiologist:  Lanier Prude, MD   Referring MD: Debbe Odea, MD   Chief Complaint: SVT  History of Present Illness:    Roy Horton is a 45 y.o. male who presents for an evaluation of SVT at the request of Dr. Azucena Cecil. Their medical history includes anxiety, GERD, obesity.  The patient last saw Dr. Azucena Cecil November 30, 2020 for palpitations.  About 2 weeks prior to that appointment he reported having heart rate of 160 bpm.  The symptoms lasted for several minutes before he drove to the emergency department.  While he was in the emergency department parking lot, his tachycardia resolved.  He had several other episodes of these rapid heartbeats.  No known history of atrial fibrillation or flutter.  At that appointment, a Zio patch was ordered.  Today he tells me he has had several episodes of this tachycardia that have lasted for hours.  He has had an episode that lasted for greater than 10 hours.  After the episode he feels wiped out and fatigued.  No syncope or presyncope.  No clear triggers.  He had another episode in the remote past around the time that his mother passed away.  Has taken Xanax for some of the episodes which seem to help.  He is with his wife today in clinic.  She tells me that at night he gasps for air.  He has daytime sleepiness.  He has increased neck circumference.  He has never been assessed for sleep apnea.  He sometimes snores.   Past Medical History:  Diagnosis Date  . Anxiety   . GERD (gastroesophageal reflux disease)    OCC  . History of concussion   . Hyperglycemia   . Migraines    improved with diet changes (cutting out MSG)  . Plantar fascia syndrome    s/p injection    Past Surgical History:  Procedure Laterality Date   . SHOULDER ARTHROSCOPY WITH OPEN ROTATOR CUFF REPAIR Left 03/30/2016   Procedure: SHOULDER ARTHROSCOPY WITH OPEN ROTATOR CUFF PARTIAL THICKNESS REPAIR;  Surgeon: Christena Flake, MD;  Location: [facility] ORS;  Service: Orthopedics;  Laterality: Left;  . SHOULDER ARTHROSCOPY WITH SUBACROMIAL DECOMPRESSION Left 03/30/2016   Procedure: SHOULDER ARTHROSCOPY WITH SUBACROMIAL DECOMPRESSION AND DEBRIDEMENT;  Surgeon: Christena Flake, MD;  Location: [facility] ORS;  Service: Orthopedics;  Laterality: Left;  . WISDOM TOOTH EXTRACTION      Current Medications: Current Meds  Medication Sig  . ALPRAZolam (XANAX) 0.5 MG tablet Take 1 tablet (0.5 mg total) by mouth daily as needed for anxiety (sedation caution).  Marland Kitchen lisinopril (ZESTRIL) 10 MG tablet Take 1-2 tablets (10-20 mg total) by mouth daily.  . metoprolol succinate (TOPROL XL) 25 MG 24 hr tablet Take 1 tablet (25 mg total) by mouth daily.     Allergies:   Avocado   Social History   Socioeconomic History  . Marital status: Married    Spouse name: Not on file  . Number of children: Not on file  . Years of education: Not on file  . Highest education level: Not on file  Occupational History  . Not on file  Tobacco Use  . Smoking status: Never Smoker  . Smokeless tobacco: Never Used  Substance and Sexual Activity  . Alcohol use: No  Alcohol/week: 0.0 standard drinks  . Drug use: No  . Sexual activity: Not on file  Other Topics Concern  . Not on file  Social History Narrative   Married 2001   Works at [employer], Occupational hygienist of Corporate investment banker Strain: Not on [employer] Insecurity: Not on file  Transportation Needs: Not on file  Physical Activity: Not on file  Stress: Not on file  Social Connections: Not on file     Family History: The patient's family history includes Heart disease in his father. There is no history of Colon cancer or Prostate cancer.  ROS:   Please see the history of present illness.    All  other systems reviewed and are negative.  EKGs/Labs/Other Studies Reviewed:    The following studies were reviewed today:  Dec 20, 2020 Zio patch personally reviewed Heart rate 45-1 97 20 episodes of SVT, longest lasting 11.4 seconds     EKG:  The ekg ordered today demonstrates sinus rhythm.  No evidence of preexcitation.  Recent Labs: 01/08/2020: BUN 12; Potassium 4.5; Sodium 142 03/11/2020: Creatinine, Ser 0.78  Recent Lipid Panel    Component Value Date/Time   CHOL 151 03/11/2020 0000   CHOL 175 03/09/2017 0000   TRIG 90 03/11/2020 0000   HDL 34 03/09/2017 0000   LDLCALC 99 03/11/2020 0000    Physical Exam:    VS:  BP (!) 142/86   Pulse 72   Ht 6\' 3"  (1.905 m)   Wt (!) 304 lb (137.9 kg)   BMI 38.00 kg/m     Wt Readings from Last 3 Encounters:  01/05/21 (!) 304 lb (137.9 kg)  11/30/20 294 lb (133.4 kg)  04/27/20 291 lb 2 oz (132.1 kg)     GEN:  Well nourished, well developed in no acute distress.  Obese HEENT: Normal NECK: No JVD; No carotid bruits LYMPHATICS: No lymphadenopathy CARDIAC: RRR, no murmurs, rubs, gallops RESPIRATORY:  Clear to auscultation without rales, wheezing or rhonchi  ABDOMEN: Soft, non-tender, non-distended MUSCULOSKELETAL:  No edema; No deformity  SKIN: Warm and dry NEUROLOGIC:  Alert and oriented x 3 PSYCHIATRIC:  Normal affect   ASSESSMENT:    1. SVT (supraventricular tachycardia) (HCC)   2. Daytime sleepiness   3. Sleep apnea, unspecified type    PLAN:    In order of problems listed above:  1. SVT Recurrent episodes with some lasting greater than 10 hours.  Symptomatic with palpitations and fatigue.  He continues to have episodes despite treatment with Toprol-XL.  Review of his ZIO monitor shows several nonsustained episodes.  He tells me that he did not have any of his long episodes while wearing the monitor.  The tracings from the ZIO monitor suggest a narrow complex tachycardia that is regular.  Differential would include  AVNRT, AT, a flutter or ORT.  I discussed the management options with the patient and his wife during today's visit including continued conservative management with metoprolol or EP study with possible ablation.  I discussed the risks, expected recovery time and expected efficacy depending on what type of arrhythmia is discovered during the EP study.  We discussed the possibility that an arrhythmia would not be inducible.  We discussed the possibility that arrhythmias could be discovered during the EP study that would not be targeted with ablation (atrial fibrillation).  He would like to proceed with scheduling an EP study with possible ablation.  He will hold his metoprolol for 1 week prior  to the procedure.  Risk, benefits, and alternatives to EP study and radiofrequency ablation for SVT were also discussed in detail today. These risks include but are not limited to complete heart block, stroke, bleeding, vascular damage, tamponade, perforation, and death. The patient understands these risk and wishes to proceed.  We will therefore proceed with catheter ablation at the next available time.  Carto and ICE are requested for the procedure.   2.  Possible OSA Daytime sleepiness, snoring, nighttime apnea, increased neck circumference. Ordered at home sleep study.  3.  Obesity  Total time spent with patient today 65 minutes. This includes reviewing records, evaluating the patient and coordinating care.  Medication Adjustments/Labs and Tests Ordered: Current medicines are reviewed at length with the patient today.  Concerns regarding medicines are outlined above.  Orders Placed This Encounter  Procedures  . Basic Metabolic Panel (BMET)  . CBC w/Diff  . EKG 12-Lead  . Itamar Sleep Study   No orders of the defined types were placed in this encounter.    Signed, Rossie Muskrat. Lalla Brothers, MD, [facility], [facility] 01/05/2021 7:49 PM    Electrophysiology [facility]

## 2021-01-06 LAB — CBC WITH DIFFERENTIAL/PLATELET
Basophils Absolute: 0 10*3/uL (ref 0.0–0.2)
Basos: 0 %
EOS (ABSOLUTE): 0.2 10*3/uL (ref 0.0–0.4)
Eos: 3 %
Hematocrit: 41.7 % (ref 37.5–51.0)
Hemoglobin: 14.1 g/dL (ref 13.0–17.7)
Immature Grans (Abs): 0 10*3/uL (ref 0.0–0.1)
Immature Granulocytes: 0 %
Lymphocytes Absolute: 2.6 10*3/uL (ref 0.7–3.1)
Lymphs: 32 %
MCH: 28.7 pg (ref 26.6–33.0)
MCHC: 33.8 g/dL (ref 31.5–35.7)
MCV: 85 fL (ref 79–97)
Monocytes Absolute: 0.6 10*3/uL (ref 0.1–0.9)
Monocytes: 7 %
Neutrophils Absolute: 4.8 10*3/uL (ref 1.4–7.0)
Neutrophils: 58 %
Platelets: 216 10*3/uL (ref 150–450)
RBC: 4.91 x10E6/uL (ref 4.14–5.80)
RDW: 13.1 % (ref 11.6–15.4)
WBC: 8.2 10*3/uL (ref 3.4–10.8)

## 2021-01-06 LAB — BASIC METABOLIC PANEL
BUN/Creatinine Ratio: 14 (ref 9–20)
BUN: 11 mg/dL (ref 6–24)
CO2: 25 mmol/L (ref 20–29)
Calcium: 9.3 mg/dL (ref 8.7–10.2)
Chloride: 102 mmol/L (ref 96–106)
Creatinine, Ser: 0.76 mg/dL (ref 0.76–1.27)
Glucose: 132 mg/dL — ABNORMAL HIGH (ref 65–99)
Potassium: 4.3 mmol/L (ref 3.5–5.2)
Sodium: 141 mmol/L (ref 134–144)
eGFR: 112 mL/min/{1.73_m2} (ref >59)

## 2021-01-07 ENCOUNTER — Telehealth: Payer: Self-pay

## 2021-01-07 NOTE — Telephone Encounter (Signed)
Per [employer] prior approval is not required for CPT 95800. Ref#[id number],  [place] triage notified to contact patient to proceed with sleep study.

## 2021-01-10 ENCOUNTER — Encounter (INDEPENDENT_AMBULATORY_CARE_PROVIDER_SITE_OTHER): Payer: Managed Care, Other (non HMO) | Admitting: Cardiology

## 2021-01-10 ENCOUNTER — Telehealth: Payer: Self-pay

## 2021-01-10 DIAGNOSIS — R400 Somnolence: Secondary | ICD-10-CM | POA: Diagnosis not present

## 2021-01-10 NOTE — Telephone Encounter (Signed)
Was able to reach Roy Horton via phone to let them know insurance has been approved and they may now begin their sleep study. Reviewed instructions with pt as followed:  Patient Testing Instructions for WatchPAT ONE  IMPORTANT Do not put battery into the WatchPAT device until bedtime, when you are ready to start test.    1) Download the [employer] "WatchPAT One" app through the [employer] or [employer] for [employer].  2) Be sure to turn on or enable access to Bluetooth in settings on your smart phone/device.  3) Make sure no other Bluetooth devices are on and within the vicinity of your smart phone/device and WatchPAT watch during testing.  4) Make sure to leave your smart phone/device plugged in and charging all night.  5) When ready for bed: Follow the instructions step by step in the WatchPAT One app to activate the testing device.  . For additional device directions, including video instruction, visit the WatchPAT One video on YouTube.  . You can search for WatchPAT One within YouTube (video is 4 minutes and 18 seconds) . or enter: [email or web address]  6) Please note: You will be prompted to enter a PIN to connect via Bluetooth when starting the test. The PIN is [id number]  7) The device is disposable, but it is recommended that you retain the device until you receive a call letting you know the study has been received and the results have been interpreted.  8) We will let you know if the study did not transmit to [place] properly after the test is completed. You do not need to call us to confirm receipt of test. . Results will be sent to your e-mail address used to set-up WatchPAT  9) Please complete the test within 2 days of receipt of device. (when PIN was issued)  NOTE: Please call the support number if you need assistance . For device or testing questions or troubleshooting the device, please call the 24-hour support line, [phone]  . [employer] Support at  [phone], Select Option 2  Pt verbalized understanding, will call back with any further questions or concerns.

## 2021-01-10 NOTE — Telephone Encounter (Signed)
Patient cancelled upcoming visit with [facility].  He states he already received results and was referred to [facility].    Please advise if patient needs to be rescheduled at this time.

## 2021-01-11 ENCOUNTER — Ambulatory Visit: Payer: Managed Care, Other (non HMO) | Admitting: Cardiology

## 2021-01-13 ENCOUNTER — Ambulatory Visit: Payer: Managed Care, Other (non HMO)

## 2021-01-13 DIAGNOSIS — G4730 Sleep apnea, unspecified: Secondary | ICD-10-CM

## 2021-01-13 DIAGNOSIS — R400 Somnolence: Secondary | ICD-10-CM

## 2021-01-13 NOTE — Procedures (Signed)
   Sleep Study Report  Patient Information Name: Roy Horton  ID: [id number] Birth Date: 01/31/1976  Age: 45 Gender: Male BMI: 37.8 (W=304 lb, H=6' 3'') Study Date: 01/10/2021 Referring Physician:  Steffanie Dunn, MD [facility]  TEST DESCRIPTION: Home sleep apnea testing was completed using the WatchPat, a Type 1 device, utilizing peripheral arterial tonometry (PAT), chest movement, actigraphy, pulse oximetry, pulse rate, body position and snore. AHI was calculated with apnea and hypopnea using valid sleep time as the denominator. RDI includes apneas, hypopneas, and RERAs. The data acquired and the scoring of sleep and all associated events were performed in accordance with the recommended standards and specifications as outlined in the AASM Manual for the Scoring of  leep and Associated Events 2.2.0 (2015).   FINDINGS: 1. No evidence of Obstructive Sleep Apnea with AHI 0.2/hr.  2. No Central Sleep Apnea. 3. Oxygen desaturations as low as 92%. 4. Minimal snoring was present. O2 sats were < 88% for . 5. Total sleep time was 6 hrs and 57 min. 6. 24.1% of total sleep time was spent in REM sleep.  7. Normal sleep onset latency at 22 min.  8. Normal REM sleep onset latency at 70 min.  9. Total awakenings were 3.   DIAGNOSIS:  Normal study with no significant sleep disordered breathing.  RECOMMENDATIONS: 1. Normal study with no significant sleep disordered breathing.  2. Healthy sleep recommendations include: adequate nightly sleep (normal 7-9 hrs/night), avoidance of caffeine afternoon and alcohol near bedtime, and maintaining a sleep environment that is cool, dark and quiet.  3. Weight loss for overweight patients is recommended.   4. Snoring recommendations include: weight loss where appropriate, side sleeping, and avoidance of alcohol before bed.  5. Operation of motor vehicle or dangerous equipment must be avoided when feeling drowsy, excessively sleepy, or mentally  fatigued.   6. An ENT consultation which may be useful for specific causes of and possible treatment of bothersome snoring .   7. Weight loss may be of benefit in reducing the severity of snoring.   8. Consider in lab sleep study.    Report prepared by: Signature: Armanda Magic, MD; [facility]; Diplomat, American Board of Sleep Medicine Electronically Signed: Jan 13, 2021

## 2021-01-20 ENCOUNTER — Other Ambulatory Visit: Payer: Self-pay

## 2021-01-20 ENCOUNTER — Ambulatory Visit (INDEPENDENT_AMBULATORY_CARE_PROVIDER_SITE_OTHER): Payer: Managed Care, Other (non HMO)

## 2021-01-20 DIAGNOSIS — I471 Supraventricular tachycardia: Secondary | ICD-10-CM

## 2021-01-20 LAB — ECHOCARDIOGRAM COMPLETE: Area-P 1/2: 3.85 cm2

## 2021-01-20 MED ORDER — PERFLUTREN LIPID MICROSPHERE
1.0000 mL | INTRAVENOUS | Status: AC | PRN
  Administered 2021-01-20: 2 mL via INTRAVENOUS

## 2021-01-24 ENCOUNTER — Telehealth: Payer: Self-pay | Admitting: *Deleted

## 2021-01-24 DIAGNOSIS — G4730 Sleep apnea, unspecified: Secondary | ICD-10-CM

## 2021-01-24 DIAGNOSIS — R400 Somnolence: Secondary | ICD-10-CM

## 2021-01-24 NOTE — Telephone Encounter (Signed)
Informed patient of sleep study results and patient understanding was verbalized.  NPSG sent to sleep pool

## 2021-01-24 NOTE — Telephone Encounter (Signed)
-----   Message from Quintella Reichert, MD sent at 01/13/2021  1:37 PM EDT ----- Normal home sleep study so in lab PSG will be ordered

## 2021-01-26 ENCOUNTER — Telehealth: Payer: Self-pay | Admitting: *Deleted

## 2021-01-26 NOTE — Telephone Encounter (Signed)
-----   Message from Reesa Chew, CMA sent at 01/24/2021  6:07 PM EDT ----- Regarding: precert NPSG

## 2021-01-26 NOTE — Telephone Encounter (Signed)
Prior Authorization for NSPG called to [employer]. # on back of card. Clinicals faxed to [phone] Reference # [id number].

## 2021-01-26 NOTE — Telephone Encounter (Signed)
Informed pt of update to tomorrows ablation time. Advised to arrive at 8:30 am tomorrow (not 7:30 am as originally instructed). Patient verbalized understanding and agreeable to plan.

## 2021-01-27 ENCOUNTER — Ambulatory Visit (HOSPITAL_COMMUNITY): Payer: Managed Care, Other (non HMO) | Admitting: Certified Registered Nurse Anesthetist

## 2021-01-27 ENCOUNTER — Ambulatory Visit (HOSPITAL_COMMUNITY)
Admission: RE | Admit: 2021-01-27 | Discharge: 2021-01-27 | Disposition: A | Discharge status: Home / Self Care | Payer: Managed Care, Other (non HMO) | Attending: Cardiology | Admitting: Cardiology

## 2021-01-27 ENCOUNTER — Encounter (HOSPITAL_COMMUNITY): Payer: Self-pay | Admitting: Cardiology

## 2021-01-27 ENCOUNTER — Other Ambulatory Visit: Payer: Self-pay

## 2021-01-27 ENCOUNTER — Encounter (HOSPITAL_COMMUNITY)
Admission: RE | Disposition: A | Discharge status: Home / Self Care | Payer: Managed Care, Other (non HMO) | Source: Home / Self Care | Attending: Cardiology

## 2021-01-27 DIAGNOSIS — I471 Supraventricular tachycardia: Secondary | ICD-10-CM | POA: Insufficient documentation

## 2021-01-27 DIAGNOSIS — Z6838 Body mass index (BMI) 38.0-38.9, adult: Secondary | ICD-10-CM | POA: Insufficient documentation

## 2021-01-27 DIAGNOSIS — K219 Gastro-esophageal reflux disease without esophagitis: Secondary | ICD-10-CM | POA: Insufficient documentation

## 2021-01-27 DIAGNOSIS — Z79899 Other long term (current) drug therapy: Secondary | ICD-10-CM | POA: Insufficient documentation

## 2021-01-27 DIAGNOSIS — E669 Obesity, unspecified: Secondary | ICD-10-CM | POA: Insufficient documentation

## 2021-01-27 DIAGNOSIS — R002 Palpitations: Secondary | ICD-10-CM | POA: Diagnosis not present

## 2021-01-27 DIAGNOSIS — F419 Anxiety disorder, unspecified: Secondary | ICD-10-CM | POA: Insufficient documentation

## 2021-01-27 DIAGNOSIS — G4730 Sleep apnea, unspecified: Secondary | ICD-10-CM | POA: Insufficient documentation

## 2021-01-27 HISTORY — PX: SVT ABLATION: EP1225

## 2021-01-27 SURGERY — SVT ABLATION
Anesthesia: General

## 2021-01-27 MED ORDER — ONDANSETRON HCL 4 MG/2ML IJ SOLN: 4.0000 mg | Freq: Four times a day (QID) | INTRAMUSCULAR | Status: DC | PRN

## 2021-01-27 MED ORDER — LIDOCAINE 2% (20 MG/ML) 5 ML SYRINGE
INTRAMUSCULAR | Status: DC | PRN
  Administered 2021-01-27: 100 mg via INTRAVENOUS

## 2021-01-27 MED ORDER — SODIUM CHLORIDE 0.9% FLUSH: 3.0000 mL | INTRAVENOUS | Status: DC | PRN

## 2021-01-27 MED ORDER — HEPARIN (PORCINE) IN NACL 1000-0.9 UT/500ML-% IV SOLN
INTRAVENOUS | Status: AC
  Filled 2021-01-27: qty 1000

## 2021-01-27 MED ORDER — SODIUM CHLORIDE 0.9 % IV SOLN: 250.0000 mL | INTRAVENOUS | Status: DC | PRN

## 2021-01-27 MED ORDER — FENTANYL CITRATE (PF) 100 MCG/2ML IJ SOLN: 25.0000 ug | INTRAMUSCULAR | Status: DC | PRN

## 2021-01-27 MED ORDER — HEPARIN (PORCINE) IN NACL 1000-0.9 UT/500ML-% IV SOLN
INTRAVENOUS | Status: DC | PRN
  Administered 2021-01-27 (×2): 500 mL

## 2021-01-27 MED ORDER — PHENYLEPHRINE 40 MCG/ML (10ML) SYRINGE FOR IV PUSH (FOR BLOOD PRESSURE SUPPORT)
PREFILLED_SYRINGE | INTRAVENOUS | Status: DC | PRN
  Administered 2021-01-27 (×3): 80 ug via INTRAVENOUS

## 2021-01-27 MED ORDER — SODIUM CHLORIDE 0.9% FLUSH: 3.0000 mL | Freq: Two times a day (BID) | INTRAVENOUS | Status: DC

## 2021-01-27 MED ORDER — DEXAMETHASONE SODIUM PHOSPHATE 10 MG/ML IJ SOLN
INTRAMUSCULAR | Status: DC | PRN
  Administered 2021-01-27: 5 mg via INTRAVENOUS

## 2021-01-27 MED ORDER — PROMETHAZINE HCL 25 MG/ML IJ SOLN: 6.2500 mg | INTRAMUSCULAR | Status: DC | PRN

## 2021-01-27 MED ORDER — PHENYLEPHRINE HCL-NACL 10-0.9 MG/250ML-% IV SOLN
INTRAVENOUS | Status: DC | PRN
  Administered 2021-01-27: 40 ug/min via INTRAVENOUS

## 2021-01-27 MED ORDER — FENTANYL CITRATE (PF) 100 MCG/2ML IJ SOLN
INTRAMUSCULAR | Status: DC | PRN
  Administered 2021-01-27 (×2): 50 ug via INTRAVENOUS

## 2021-01-27 MED ORDER — ISOPROTERENOL HCL 0.2 MG/ML IJ SOLN
INTRAMUSCULAR | Status: AC
  Filled 2021-01-27: qty 5

## 2021-01-27 MED ORDER — ONDANSETRON HCL 4 MG/2ML IJ SOLN
INTRAMUSCULAR | Status: DC | PRN
  Administered 2021-01-27: 4 mg via INTRAVENOUS

## 2021-01-27 MED ORDER — MIDAZOLAM HCL 5 MG/5ML IJ SOLN
INTRAMUSCULAR | Status: DC | PRN
  Administered 2021-01-27: 2 mg via INTRAVENOUS

## 2021-01-27 MED ORDER — ACETAMINOPHEN 10 MG/ML IV SOLN
1000.0000 mg | Freq: Once | INTRAVENOUS | Status: DC | PRN
Start: 2021-01-27 — End: 2021-01-27

## 2021-01-27 MED ORDER — ACETAMINOPHEN 325 MG PO TABS: 650.0000 mg | ORAL_TABLET | ORAL | Status: DC | PRN

## 2021-01-27 MED ORDER — BUPIVACAINE HCL (PF) 0.25 % IJ SOLN
INTRAMUSCULAR | Status: AC
  Filled 2021-01-27: qty 30

## 2021-01-27 MED ORDER — ISOPROTERENOL HCL 0.2 MG/ML IJ SOLN
INTRAVENOUS | Status: DC | PRN
  Administered 2021-01-27: 2 ug/min via INTRAVENOUS

## 2021-01-27 MED ORDER — PROPOFOL 10 MG/ML IV BOLUS
INTRAVENOUS | Status: DC | PRN
  Administered 2021-01-27: 100 mg via INTRAVENOUS
  Administered 2021-01-27: 200 mg via INTRAVENOUS
  Administered 2021-01-27: 50 mg via INTRAVENOUS

## 2021-01-27 MED ORDER — SODIUM CHLORIDE 0.9 % IV SOLN: INTRAVENOUS | Status: DC

## 2021-01-27 SURGICAL SUPPLY — 14 items
BLANKET WARM UNDERBOD FULL ACC (MISCELLANEOUS) ×2 IMPLANT
CATH CRD2 QUAD 6FR 5 (CATHETERS) ×2 IMPLANT
CATH DECANAV F CURVE (CATHETERS) ×2 IMPLANT
CATH JOSEPH QUAD ALLRED 6F REP (CATHETERS) ×2 IMPLANT
CLOSURE PERCLOSE PROSTYLE (VASCULAR PRODUCTS) ×6 IMPLANT
PACK EP LATEX FREE (CUSTOM PROCEDURE TRAY) ×2
PACK EP LF (CUSTOM PROCEDURE TRAY) ×1 IMPLANT
PAD PRO RADIOLUCENT 2001M-C (PAD) ×2 IMPLANT
PATCH CARTO3 (PAD) ×2 IMPLANT
SHEATH PINNACLE 6F 10CM (SHEATH) ×2 IMPLANT
SHEATH PINNACLE 7F 10CM (SHEATH) ×2 IMPLANT
SHEATH PINNACLE 8F 10CM (SHEATH) ×2 IMPLANT
SHEATH PROBE COVER 6X72 (BAG) ×2 IMPLANT
TUBING SMART ABLATE COOLFLOW (TUBING) ×2 IMPLANT

## 2021-01-27 NOTE — Anesthesia Preprocedure Evaluation (Addendum)
Anesthesia Evaluation  Patient identified by MRN, date of birth, ID band Patient awake    Reviewed: Allergy & Precautions, NPO status , Patient's Chart, lab work & pertinent test results  Airway Mallampati: III  TM Distance: >3 FB Neck ROM: Full    Dental  (+) Teeth Intact   Pulmonary sleep apnea ,    Pulmonary exam normal        Cardiovascular hypertension, Pt. on medications and Pt. on home beta blockers + dysrhythmias Supra Ventricular Tachycardia  Rhythm:Regular Rate:Normal     Neuro/Psych  Headaches, Anxiety    GI/Hepatic Neg liver ROS, GERD  ,  Endo/Other  negative endocrine ROS  Renal/GU negative Renal ROS  negative genitourinary   Musculoskeletal negative musculoskeletal ROS (+)   Abdominal (+)  Abdomen: soft. Bowel sounds: normal.  Peds  Hematology negative hematology ROS (+)   Anesthesia Other Findings   Reproductive/Obstetrics                             Anesthesia Physical Anesthesia Plan  ASA: 3  Anesthesia Plan: General   Post-op Pain Management:    Induction: Intravenous  PONV Risk Score and Plan: 2 and Ondansetron, Dexamethasone, Midazolam and Treatment may vary due to age or medical condition  Airway Management Planned: Mask and Oral ETT  Additional Equipment: None  Intra-op Plan:   Post-operative Plan: Extubation in OR  Informed Consent: I have reviewed the patients History and Physical, chart, labs and discussed the procedure including the risks, benefits and alternatives for the proposed anesthesia with the patient or authorized representative who has indicated his/her understanding and acceptance.     Dental advisory given  Plan Discussed with: CRNA  Anesthesia Plan Comments: (ECHO 06/22: 1. Left ventricular ejection fraction, by estimation, is 60 to 65%. The  left ventricle has normal function. The left ventricle has no regional  wall motion  abnormalities. Left ventricular diastolic parameters were  normal.  2. Right ventricular systolic function is normal. The right ventricular  size is normal.  3. Left atrial size was mildly dilated.  4. The mitral valve is normal in structure. No evidence of mitral valve  regurgitation. No evidence of mitral stenosis.  5. The aortic valve is normal in structure. Aortic valve regurgitation is  not visualized. No aortic stenosis is present.  6. The inferior vena cava is normal in size with greater than 50%  respiratory variability, suggesting right atrial pressure of 3 mmHg.  Lab Results      Component                Value               Date                      WBC                      8.2                 01/05/2021                HGB                      14.1                01/05/2021  HCT                      41.7                01/05/2021                MCV                      85                  01/05/2021                PLT                      216                 01/05/2021           Lab Results      Component                Value               Date                      NA                       141                 01/05/2021                K                        4.3                 01/05/2021                CO2                      25                  01/05/2021                GLUCOSE                  132 (H)             01/05/2021                BUN                      11                  01/05/2021                CREATININE               0.76                01/05/2021                CALCIUM                  9.3                 01/05/2021                GFRNONAA  112                 01/08/2020                GFRAA                    130                 01/08/2020          )      Anesthesia Quick Evaluation

## 2021-01-27 NOTE — Anesthesia Procedure Notes (Signed)
Procedure Name: LMA Insertion Date/Time: 01/27/2021 11:09 AM Performed by: Waynard Edwards, CRNA Pre-anesthesia Checklist: Patient identified, Emergency Drugs available, Suction available and Patient being monitored Patient Re-evaluated:Patient Re-evaluated prior to induction Oxygen Delivery Method: Circle system utilized Preoxygenation: Pre-oxygenation with 100% oxygen Induction Type: IV induction Ventilation: Mask ventilation without difficulty and Oral airway inserted - appropriate to patient size LMA: LMA inserted LMA Size: 5.0 Number of attempts: 1 Airway Equipment and Method: Oral airway Placement Confirmation: positive ETCO2 Tube secured with: Tape Dental Injury: Teeth and Oropharynx as per pre-operative assessment

## 2021-01-27 NOTE — Interval H&P Note (Signed)
History and Physical Interval Note:  01/27/2021 10:22 AM  Roy Horton  has presented today for surgery, with the diagnosis of svt.  The various methods of treatment have been discussed with the patient and family. After consideration of risks, benefits and other options for treatment, the patient has consented to  Procedure(s): SVT ABLATION (N/A) as a surgical intervention.  The patient's history has been reviewed, patient examined, no change in status, stable for surgery.  I have reviewed the patient's chart and labs.  Questions were answered to the patient's satisfaction.     [name]

## 2021-01-27 NOTE — Progress Notes (Signed)
Discharge instructions reviewed with pt and his wife. Both voice understanding. 

## 2021-01-27 NOTE — Transfer of Care (Signed)
Immediate Anesthesia Transfer of Care Note  Patient: Roy Horton  Procedure(s) Performed: SVT ABLATION  Patient Location: Cath Lab  Anesthesia Type:General  Level of Consciousness: awake, alert , oriented and patient cooperative  Airway & Oxygen Therapy: Patient Spontanous Breathing and Patient connected to nasal cannula oxygen  Post-op Assessment: Report given to RN and Post -op Vital signs reviewed and stable  Post vital signs: Reviewed and stable  Last Vitals:  Vitals Value Taken Time  BP 108/58 01/27/21 1222  Temp 36.5 C 01/27/21 1218  Pulse 79 01/27/21 1223  Resp 13 01/27/21 1223  SpO2 100 % 01/27/21 1223  Vitals shown include unvalidated device data.  Last Pain:  Vitals:   01/27/21 1218  TempSrc: Tympanic  PainSc: 0-No pain      Patients Stated Pain Goal: 3 (01/27/21 1218)  Complications: No notable events documented.

## 2021-01-27 NOTE — Discharge Instructions (Addendum)
Post procedure care instructions No driving for 4 days. No lifting over 5 lbs for 1 week. No vigorous or sexual activity for 1 week. You may return to work/your usual activities on 02/04/21. Keep procedure site clean & dry. If you notice increased pain, swelling, bleeding or pus, call/return!  You may shower after 24 hours, but no soaking in baths/hot tubs/pools for 1 week.  Cardiac Ablation, Care After  This sheet gives you information about how to care for yourself after your procedure. Your health care provider may also give you more specific instructions. If you have problems or questions, contact your health care provider. What can I expect after the procedure? After the procedure, it is common to have: Bruising around your puncture site. Tenderness around your puncture site. Skipped heartbeats. Tiredness (fatigue).  Follow these instructions at home: Puncture site care  Follow instructions from your health care provider about how to take care of your puncture site. Make sure you: If present, leave stitches (sutures), skin glue, or adhesive strips in place. These skin closures may need to stay in place for up to 2 weeks. If adhesive strip edges start to loosen and curl up, you may trim the loose edges. Do not remove adhesive strips completely unless your health care provider tells you to do that. If a large square bandage is present, this may be removed 24 hours after surgery.  Check your puncture site every day for signs of infection. Check for: Redness, swelling, or pain. Fluid or blood. If your puncture site starts to bleed, lie down on your back, apply firm pressure to the area, and contact your health care provider. Warmth. Pus or a bad smell. Driving Do not drive for at least 4 days after your procedure or however long your health care provider recommends. (Do not resume driving if you have previously been instructed not to drive for other health reasons.) Do not drive or use heavy  machinery while taking prescription pain medicine. Activity Avoid activities that take a lot of effort for at least 7 days after your procedure. Do not lift anything that is heavier than 5 lb (4.5 kg) for one week.  No sexual activity for 1 week.  Return to your normal activities as told by your health care provider. Ask your health care provider what activities are safe for you. General instructions Take over-the-counter and prescription medicines only as told by your health care provider. Do not use any products that contain nicotine or tobacco, such as cigarettes and e-cigarettes. If you need help quitting, ask your health care provider. You may shower after 24 hours, but Do not take baths, swim, or use a hot tub for 1 week.  Do not drink alcohol for 24 hours after your procedure. Keep all follow-up visits as told by your health care provider. This is important. Contact a health care provider if: You have redness, mild swelling, or pain around your puncture site. You have fluid or blood coming from your puncture site that stops after applying firm pressure to the area. Your puncture site feels warm to the touch. You have pus or a bad smell coming from your puncture site. You have a fever. You have chest pain or discomfort that spreads to your neck, jaw, or arm. You are sweating a lot. You feel nauseous. You have a fast or irregular heartbeat. You have shortness of breath. You are dizzy or light-headed and feel the need to lie down. You have pain or numbness in the  arm or leg closest to your puncture site. Get help right away if: Your puncture site suddenly swells. Your puncture site is bleeding and the bleeding does not stop after applying firm pressure to the area. These symptoms may represent a serious problem that is an emergency. Do not wait to see if the symptoms will go away. Get medical help right away. Call your local emergency services (911 in the [place] Do not drive yourself  to the hospital. Summary After the procedure, it is normal to have bruising and tenderness at the puncture site in your groin, neck, or forearm. Check your puncture site every day for signs of infection. Get help right away if your puncture site is bleeding and the bleeding does not stop after applying firm pressure to the area. This is a medical emergency. This information is not intended to replace advice given to you by your health care provider. Make sure you discuss any questions you have with your health care provider.

## 2021-01-28 NOTE — Anesthesia Postprocedure Evaluation (Signed)
Anesthesia Post Note  Patient: FLETCHER OSTERMILLER  Procedure(s) Performed: SVT ABLATION     Patient location during evaluation: PACU Anesthesia Type: General Level of consciousness: awake and alert, oriented and patient cooperative Pain management: pain level controlled Vital Signs Assessment: post-procedure vital signs reviewed and stable Respiratory status: spontaneous breathing, nonlabored ventilation and respiratory function stable Cardiovascular status: blood pressure returned to baseline and stable Postop Assessment: no apparent nausea or vomiting Anesthetic complications: no   No notable events documented.  Last Vitals:  Vitals:   01/27/21 1445 01/27/21 1515  BP: (!) 115/49 132/62  Pulse: 76 92  Resp: 15 19  Temp:    SpO2: 97% 97%    Last Pain:  Vitals:   01/27/21 1600  TempSrc:   PainSc: 0-No pain                 Lannie Fields

## 2021-01-31 NOTE — Telephone Encounter (Signed)
Approval received from [facility] for NSPG. Ok to schedule. Auth #[id number]. Valid dates 01/31/21 to 05/01/21.

## 2021-02-04 NOTE — Addendum Note (Signed)
Addended by: Reesa Chew on: 02/04/2021 11:43 AM   Modules accepted: Orders

## 2021-02-08 ENCOUNTER — Ambulatory Visit (HOSPITAL_COMMUNITY): Payer: Managed Care, Other (non HMO) | Admitting: Physician Assistant

## 2021-02-09 NOTE — Telephone Encounter (Signed)
Patient is scheduled for lab study on 04/18/21. Patient understands his sleep study will be done at [facility] sleep lab. Patient understands he will receive a sleep packet in a week or so. Patient understands to call if he does not receive the sleep packet in a timely manner. Per dpr Left detailed message on voicemail with date and time of titration and informed patient to call back to confirm or reschedule.  

## 2021-03-01 NOTE — Progress Notes (Deleted)
Electrophysiology Office Follow up Visit Note:    Date:  03/01/2021   ID:  Roy Horton, DOB September 20, 1975, MRN [id number]  PCP:  Joaquim Nam, MD  [facility] Cardiologist:  Debbe Odea, MD  [facility] Electrophysiologist:  Lanier Prude, MD    Interval History:    Roy Horton is a 45 y.o. male who presents for a follow up visit after an EP study on January 27, 2021.  During the EP study, no tachycardias were inducible.  He presents today for follow-up.     Past Medical History:  Diagnosis Date   Anxiety    GERD (gastroesophageal reflux disease)    OCC   History of concussion    Hyperglycemia    Migraines    improved with diet changes (cutting out MSG)   Plantar fascia syndrome    s/p injection    Past Surgical History:  Procedure Laterality Date   SHOULDER ARTHROSCOPY WITH OPEN ROTATOR CUFF REPAIR Left 03/30/2016   Procedure: SHOULDER ARTHROSCOPY WITH OPEN ROTATOR CUFF PARTIAL THICKNESS REPAIR;  Surgeon: Christena Flake, MD;  Location: [facility] ORS;  Service: Orthopedics;  Laterality: Left;   SHOULDER ARTHROSCOPY WITH SUBACROMIAL DECOMPRESSION Left 03/30/2016   Procedure: SHOULDER ARTHROSCOPY WITH SUBACROMIAL DECOMPRESSION AND DEBRIDEMENT;  Surgeon: Christena Flake, MD;  Location: [facility] ORS;  Service: Orthopedics;  Laterality: Left;   SVT ABLATION N/A 01/27/2021   Procedure: SVT ABLATION;  Surgeon: Lanier Prude, MD;  Location: [facility] INVASIVE CV LAB;  Service: Cardiovascular;  Laterality: N/A;   WISDOM TOOTH EXTRACTION      Current Medications: No outpatient medications have been marked as taking for the 03/02/21 encounter (Appointment) with Lanier Prude, MD.     Allergies:   Avocado   Social History   Socioeconomic History   Marital status: Married    Spouse name: Not on file   Number of children: Not on file   Years of education: Not on file   Highest education level: Not on file  Occupational History   Not on file  Tobacco Use    Smoking status: Never   Smokeless tobacco: Never  Substance and Sexual Activity   Alcohol use: No    Alcohol/week: 0.0 standard drinks   Drug use: No   Sexual activity: Not on file  Other Topics Concern   Not on file  Social History Narrative   Married 2001   Works at [employer], Occupational hygienist of Corporate investment banker Strain: Not on [employer] Insecurity: Not on file  Transportation Needs: Not on file  Physical Activity: Not on file  Stress: Not on file  Social Connections: Not on file     Family History: The patient's family history includes Heart disease in his father. There is no history of Colon cancer or Prostate cancer.  ROS:   Please see the history of present illness.    All other systems reviewed and are negative.  EKGs/Labs/Other Studies Reviewed:    The following studies were reviewed today:   EKG:  The ekg ordered today demonstrates ***  Recent Labs: 01/05/2021: BUN 11; Creatinine, Ser 0.76; Hemoglobin 14.1; Platelets 216; Potassium 4.3; Sodium 141  Recent Lipid Panel    Component Value Date/Time   CHOL 151 03/11/2020 0000   CHOL 175 03/09/2017 0000   TRIG 90 03/11/2020 0000   HDL 34 03/09/2017 0000   LDLCALC 99 03/11/2020 0000    Physical Exam:    VS:  There were no vitals taken for this visit.    Wt Readings from Last 3 Encounters:  01/27/21 300 lb (136.1 kg)  01/05/21 (!) 304 lb (137.9 kg)  11/30/20 294 lb (133.4 kg)     GEN: *** Well nourished, well developed in no acute distress HEENT: Normal NECK: No JVD; No carotid bruits LYMPHATICS: No lymphadenopathy CARDIAC: ***RRR, no murmurs, rubs, gallops RESPIRATORY:  Clear to auscultation without rales, wheezing or rhonchi  ABDOMEN: Soft, non-tender, non-distended MUSCULOSKELETAL:  No edema; No deformity  SKIN: Warm and dry NEUROLOGIC:  Alert and oriented x 3 PSYCHIATRIC:  Normal affect   ASSESSMENT:    1. SVT (supraventricular tachycardia) (HCC)   2. Primary  hypertension   3. Sleep apnea, unspecified type    PLAN:    In order of problems listed above:           Total time spent with patient today *** minutes. This includes reviewing records, evaluating the patient and coordinating care.   Medication Adjustments/Labs and Tests Ordered: Current medicines are reviewed at length with the patient today.  Concerns regarding medicines are outlined above.  No orders of the defined types were placed in this encounter.  No orders of the defined types were placed in this encounter.    Signed, Steffanie Dunn, MD, [facility], [facility] 03/01/2021 2:36 PM    Electrophysiology [facility]

## 2021-03-02 ENCOUNTER — Ambulatory Visit: Payer: Managed Care, Other (non HMO) | Admitting: Cardiology

## 2021-03-02 DIAGNOSIS — I471 Supraventricular tachycardia: Secondary | ICD-10-CM

## 2021-03-02 DIAGNOSIS — I10 Essential (primary) hypertension: Secondary | ICD-10-CM

## 2021-03-02 DIAGNOSIS — G4730 Sleep apnea, unspecified: Secondary | ICD-10-CM

## 2021-03-08 ENCOUNTER — Telehealth: Payer: Self-pay

## 2021-03-08 NOTE — Telephone Encounter (Signed)
Pt rescheduled for loop implant 03/16/2021 d/t case still pending with [employer].  Pt aware.

## 2021-03-09 ENCOUNTER — Ambulatory Visit: Payer: Managed Care, Other (non HMO) | Admitting: Cardiology

## 2021-03-16 ENCOUNTER — Encounter: Payer: Self-pay | Admitting: Cardiology

## 2021-03-16 ENCOUNTER — Other Ambulatory Visit: Payer: Self-pay

## 2021-03-16 ENCOUNTER — Ambulatory Visit (INDEPENDENT_AMBULATORY_CARE_PROVIDER_SITE_OTHER): Payer: Managed Care, Other (non HMO) | Admitting: Cardiology

## 2021-03-16 VITALS — BP 130/82 | HR 64 | Ht 75.0 in | Wt 303.0 lb

## 2021-03-16 DIAGNOSIS — I10 Essential (primary) hypertension: Secondary | ICD-10-CM | POA: Diagnosis not present

## 2021-03-16 DIAGNOSIS — I471 Supraventricular tachycardia: Secondary | ICD-10-CM

## 2021-03-16 NOTE — Progress Notes (Signed)
Electrophysiology Office Follow up Visit Note:    Date:  03/16/2021   ID:  Roy Horton, DOB 08/20/1975, MRN [id number]  PCP:  Joaquim Nam, MD  [facility] Cardiologist:  Debbe Odea, MD  [facility] Electrophysiologist:  Lanier Prude, MD    Interval History:    Roy Horton is a 45 y.o. male who presents for a follow up visit after an EP study on January 27, 2021.  During the EP study was unable to induce any sustained tachycardias.  His AV conduction seemed normal.  He tells me that since the procedure he has not had any sustained episodes of the tachyarrhythmia.  He is doing well taking the metoprolol succinate 25 mg by mouth once daily.     Past Medical History:  Diagnosis Date   Anxiety    GERD (gastroesophageal reflux disease)    OCC   History of concussion    Hyperglycemia    Migraines    improved with diet changes (cutting out MSG)   Plantar fascia syndrome    s/p injection    Past Surgical History:  Procedure Laterality Date   SHOULDER ARTHROSCOPY WITH OPEN ROTATOR CUFF REPAIR Left 03/30/2016   Procedure: SHOULDER ARTHROSCOPY WITH OPEN ROTATOR CUFF PARTIAL THICKNESS REPAIR;  Surgeon: Christena Flake, MD;  Location: [facility] ORS;  Service: Orthopedics;  Laterality: Left;   SHOULDER ARTHROSCOPY WITH SUBACROMIAL DECOMPRESSION Left 03/30/2016   Procedure: SHOULDER ARTHROSCOPY WITH SUBACROMIAL DECOMPRESSION AND DEBRIDEMENT;  Surgeon: Christena Flake, MD;  Location: [facility] ORS;  Service: Orthopedics;  Laterality: Left;   SVT ABLATION N/A 01/27/2021   Procedure: SVT ABLATION;  Surgeon: Lanier Prude, MD;  Location: [facility] INVASIVE CV LAB;  Service: Cardiovascular;  Laterality: N/A;   WISDOM TOOTH EXTRACTION      Current Medications: Current Meds  Medication Sig   acetaminophen (TYLENOL) 500 MG tablet Take 1,000 mg by mouth every 6 (six) hours as needed for moderate pain or headache.   ALPRAZolam (XANAX) 0.5 MG tablet Take 1 tablet (0.5 mg total) by  mouth daily as needed for anxiety (sedation caution).   lisinopril (ZESTRIL) 10 MG tablet Take 1-2 tablets (10-20 mg total) by mouth daily. (Patient taking differently: Take 10 mg by mouth daily.)   metoprolol succinate (TOPROL XL) 25 MG 24 hr tablet Take 1 tablet (25 mg total) by mouth daily.     Allergies:   Avocado   Social History   Socioeconomic History   Marital status: Married    Spouse name: Not on file   Number of children: Not on file   Years of education: Not on file   Highest education level: Not on file  Occupational History   Not on file  Tobacco Use   Smoking status: Never   Smokeless tobacco: Never  Substance and Sexual Activity   Alcohol use: No    Alcohol/week: 0.0 standard drinks   Drug use: No   Sexual activity: Not on file  Other Topics Concern   Not on file  Social History Narrative   Married 2001   Works at [employer], Occupational hygienist of Corporate investment banker Strain: Not on [employer] Insecurity: Not on file  Transportation Needs: Not on file  Physical Activity: Not on file  Stress: Not on file  Social Connections: Not on file     Family History: The patient's family history includes Heart disease in his father. There is no history of Colon cancer or  Prostate cancer.  ROS:   Please see the history of present illness.    All other systems reviewed and are negative.  EKGs/Labs/Other Studies Reviewed:    The following studies were reviewed today: EP study records  EKG:  The ekg ordered today demonstrates sinus rhythm.  Intervals are normal.  No preexcitation.  Recent Labs: 01/05/2021: BUN 11; Creatinine, Ser 0.76; Hemoglobin 14.1; Platelets 216; Potassium 4.3; Sodium 141  Recent Lipid Panel    Component Value Date/Time   CHOL 151 03/11/2020 0000   CHOL 175 03/09/2017 0000   TRIG 90 03/11/2020 0000   HDL 34 03/09/2017 0000   LDLCALC 99 03/11/2020 0000    Physical Exam:    VS:  BP 130/82 (BP Location: Left Arm,  Patient Position: Sitting, Cuff Size: Large)   Pulse 64   Ht 6\' 3"  (1.905 m)   Wt (!) 303 lb (137.4 kg)   SpO2 98%   BMI 37.87 kg/m     Wt Readings from Last 3 Encounters:  03/16/21 (!) 303 lb (137.4 kg)  01/27/21 300 lb (136.1 kg)  01/05/21 (!) 304 lb (137.9 kg)     GEN:  Well nourished, well developed in no acute distress.  Obese HEENT: Normal NECK: No JVD; No carotid bruits LYMPHATICS: No lymphadenopathy CARDIAC: RRR, no murmurs, rubs, gallops RESPIRATORY:  Clear to auscultation without rales, wheezing or rhonchi  ABDOMEN: Soft, non-tender, non-distended MUSCULOSKELETAL:  No edema; No deformity  SKIN: Warm and dry NEUROLOGIC:  Alert and oriented x 3 PSYCHIATRIC:  Normal affect   ASSESSMENT:    1. SVT (supraventricular tachycardia) (HCC)   2. Primary hypertension    PLAN:    In order of problems listed above:   1. SVT (supraventricular tachycardia) (HCC) No recurrent episodes since metoprolol XL has been started.  I would recommend that we continue the metoprolol for now.  Can revisit this medication in a year or so to see if we can successfully wean off of the medication.  2. Primary hypertension Controlled.  Continue lisinopril.   Follow-up 1 year     Medication Adjustments/Labs and Tests Ordered: Current medicines are reviewed at length with the patient today.  Concerns regarding medicines are outlined above.  Orders Placed This Encounter  Procedures   EKG 12-Lead   No orders of the defined types were placed in this encounter.    Signed, 03/07/21, MD, [facility], [facility] 03/16/2021 9:24 PM    Electrophysiology [facility]

## 2021-03-16 NOTE — Patient Instructions (Signed)
Medication Instructions:  Your physician recommends that you continue on your current medications as directed. Please refer to the Current Medication list given to you today.  *If you need a refill on your cardiac medications before your next appointment, please call your pharmacy*   Lab Work: None ordered.  If you have labs (blood work) drawn today and your tests are completely normal, you will receive your results only by: MyChart Message (if you have MyChart) OR A paper copy in the mail If you have any lab test that is abnormal or we need to change your treatment, we will call you to review the results.   Testing/Procedures: None ordered.    Follow-Up: At [facility], you and your health needs are our priority.  As part of our continuing mission to provide you with exceptional heart care, we have created designated Provider Care Teams.  These Care Teams include your primary Cardiologist (physician) and Advanced Practice Providers (APPs -  Physician Assistants and Nurse Practitioners) who all work together to provide you with the care you need, when you need it.  We recommend signing up for the patient portal called "MyChart".  Sign up information is provided on this After Visit Summary.  MyChart is used to connect with patients for Virtual Visits (Telemedicine).  Patients are able to view lab/test results, encounter notes, upcoming appointments, etc.  Non-urgent messages can be sent to your provider as well.   To learn more about what you can do with MyChart, go to [email or web address].    Your next appointment:   12 month(s)  The format for your next appointment:   In Person  Provider:   Cameron Lambert, MD    

## 2021-03-17 ENCOUNTER — Other Ambulatory Visit: Payer: Self-pay | Admitting: Family Medicine

## 2021-04-18 ENCOUNTER — Encounter (HOSPITAL_BASED_OUTPATIENT_CLINIC_OR_DEPARTMENT_OTHER): Payer: Managed Care, Other (non HMO) | Admitting: Cardiology

## 2021-05-05 ENCOUNTER — Other Ambulatory Visit: Payer: Self-pay

## 2021-05-05 ENCOUNTER — Encounter: Payer: Self-pay | Admitting: Emergency Medicine

## 2021-05-05 ENCOUNTER — Emergency Department: Payer: Managed Care, Other (non HMO)

## 2021-05-05 ENCOUNTER — Emergency Department
Admission: EM | Admit: 2021-05-05 | Discharge: 2021-05-05 | Disposition: A | Discharge status: Home / Self Care | Payer: Managed Care, Other (non HMO) | Attending: Emergency Medicine | Admitting: Emergency Medicine

## 2021-05-05 DIAGNOSIS — Z79899 Other long term (current) drug therapy: Secondary | ICD-10-CM | POA: Diagnosis not present

## 2021-05-05 DIAGNOSIS — I10 Essential (primary) hypertension: Secondary | ICD-10-CM | POA: Diagnosis not present

## 2021-05-05 DIAGNOSIS — I4892 Unspecified atrial flutter: Secondary | ICD-10-CM

## 2021-05-05 DIAGNOSIS — R000 Tachycardia, unspecified: Secondary | ICD-10-CM | POA: Diagnosis present

## 2021-05-05 HISTORY — DX: Tachycardia, unspecified: R00.0

## 2021-05-05 LAB — CBC WITH DIFFERENTIAL/PLATELET
Abs Immature Granulocytes: 0.04 10*3/uL (ref 0.00–0.07)
Basophils Absolute: 0 10*3/uL (ref 0.0–0.1)
Basophils Relative: 0 %
Eosinophils Absolute: 0.2 10*3/uL (ref 0.0–0.5)
Eosinophils Relative: 2 %
HCT: 46.6 % (ref 39.0–52.0)
Hemoglobin: 16.7 g/dL (ref 13.0–17.0)
Immature Granulocytes: 0 %
Lymphocytes Relative: 32 %
Lymphs Abs: 3.3 10*3/uL (ref 0.7–4.0)
MCH: 29.9 pg (ref 26.0–34.0)
MCHC: 35.8 g/dL (ref 30.0–36.0)
MCV: 83.5 fL (ref 80.0–100.0)
Monocytes Absolute: 0.7 10*3/uL (ref 0.1–1.0)
Monocytes Relative: 7 %
Neutro Abs: 6.1 10*3/uL (ref 1.7–7.7)
Neutrophils Relative %: 59 %
Platelets: 232 10*3/uL (ref 150–400)
RBC: 5.58 MIL/uL (ref 4.22–5.81)
RDW: 12.3 % (ref 11.5–15.5)
WBC: 10.4 10*3/uL (ref 4.0–10.5)
nRBC: 0 % (ref 0.0–0.2)

## 2021-05-05 LAB — COMPREHENSIVE METABOLIC PANEL
ALT: 32 U/L (ref 0–44)
AST: 26 U/L (ref 15–41)
Albumin: 4.4 g/dL (ref 3.5–5.0)
Alkaline Phosphatase: 52 U/L (ref 38–126)
Anion gap: 10 (ref 5–15)
BUN: 12 mg/dL (ref 6–20)
CO2: 27 mmol/L (ref 22–32)
Calcium: 9.1 mg/dL (ref 8.9–10.3)
Chloride: 103 mmol/L (ref 98–111)
Creatinine, Ser: 0.7 mg/dL (ref 0.61–1.24)
GFR, Estimated: >60 mL/min (ref >60)
Glucose, Bld: 155 mg/dL — ABNORMAL HIGH (ref 70–99)
Potassium: 4 mmol/L (ref 3.5–5.1)
Sodium: 140 mmol/L (ref 135–145)
Total Bilirubin: 1.2 mg/dL (ref 0.3–1.2)
Total Protein: 7.7 g/dL (ref 6.5–8.1)

## 2021-05-05 LAB — MAGNESIUM: Magnesium: 2 mg/dL (ref 1.7–2.4)

## 2021-05-05 LAB — TROPONIN I (HIGH SENSITIVITY): Troponin I (High Sensitivity): 27 ng/L — ABNORMAL HIGH (ref <18)

## 2021-05-05 LAB — T4, FREE: Free T4: 0.76 ng/dL (ref 0.61–1.12)

## 2021-05-05 LAB — TSH: TSH: 1.469 u[IU]/mL (ref 0.350–4.500)

## 2021-05-05 MED ORDER — METOPROLOL SUCCINATE ER 50 MG PO TB24: 50.0000 mg | ORAL_TABLET | Freq: Every day | ORAL | 0 refills | Status: DC

## 2021-05-05 MED ORDER — ADENOSINE 6 MG/2ML IV SOLN
6.0000 mg | Freq: Once | INTRAVENOUS | Status: AC
  Administered 2021-05-05: 6 mg via INTRAVENOUS
  Filled 2021-05-05: qty 2

## 2021-05-05 MED ORDER — DILTIAZEM HCL 25 MG/5ML IV SOLN
25.0000 mg | Freq: Once | INTRAVENOUS | Status: DC
  Filled 2021-05-05: qty 5

## 2021-05-05 MED ORDER — METOPROLOL SUCCINATE ER 50 MG PO TB24
50.0000 mg | ORAL_TABLET | Freq: Every day | ORAL | Status: DC
  Administered 2021-05-05: 50 mg via ORAL
  Filled 2021-05-05: qty 1

## 2021-05-05 MED ORDER — SODIUM CHLORIDE 0.9 % IV BOLUS
1000.0000 mL | Freq: Once | INTRAVENOUS | Status: AC
  Administered 2021-05-05: 1000 mL via INTRAVENOUS

## 2021-05-05 MED ORDER — ADENOSINE 12 MG/4ML IV SOLN
12.0000 mg | Freq: Once | INTRAVENOUS | Status: AC
  Administered 2021-05-05: 12 mg via INTRAVENOUS

## 2021-05-05 MED ORDER — ADENOSINE 6 MG/2ML IV SOLN
INTRAVENOUS | Status: AC
  Filled 2021-05-05: qty 4

## 2021-05-05 NOTE — ED Notes (Signed)
Repeat ekg obtained. Per Roy Horton underlying rhythm is a flutter.

## 2021-05-05 NOTE — ED Notes (Signed)
Verbal order for 12mg  adenosine as 6mg  was not effective. Per Dr .

## 2021-05-05 NOTE — ED Notes (Signed)
Entered room to give cardizem that was just ordered. Pt is now NSR on monitor. Validated VS show that at 1340 HR was in 160s then at 1345 HR was in 80s. Dr Scotty Court informed. Cardizem held.

## 2021-05-05 NOTE — ED Triage Notes (Signed)
Pt comes into the ED via POV c/o tachycardia.  PT states he has been having problems with this in the past and has been seeing a cardiologist.  Pt states he has been at a HR of 160 for the past 3-4 hours today.  Pt Pt denies any CP, or dizziness, but does admit to a slight amount of SHOB.

## 2021-05-05 NOTE — ED Provider Notes (Signed)
HPI: Pt is a 45 y.o. male who presents with complaints of tachycardia   The patient p/w tachycardia. SVT history seen by Dr. Lalla Brothers- 160 bpm for 3-4 hours. On metoprolol. No missed doses. No blood thinners.   ROS: Denies fever, chest pain, vomiting  Past Medical History:  Diagnosis Date   Anxiety    GERD (gastroesophageal reflux disease)    OCC   History of concussion    Hyperglycemia    Migraines    improved with diet changes (cutting out MSG)   Plantar fascia syndrome    s/p injection   There were no vitals filed for this visit.  Focused Physical Exam: Gen: No acute distress Head: atraumatic, normocephalic Eyes: Extraocular movements grossly intact; conjunctiva clear CV: tachycardia, irregular.  Lung: No increased WOB, no stridor GI: ND, no obvious masses Neuro: Alert and awake  Medical Decision Making and Plan: Given the patient's initial medical screening exam, the following diagnostic evaluation has been ordered. The patient will be placed in the appropriate treatment space, once one is available, to complete the evaluation and treatment. I have discussed the plan of care with the patient and I have advised the patient that an ED physician or mid-level practitioner will reevaluate their condition after the test results have been received, as the results may give them additional insight into the type of treatment they may need.   Diagnostics: labs, needs room afib with rvr   Treatments: none immediately   Concha Se, MD 05/05/21 1245

## 2021-05-05 NOTE — ED Notes (Signed)
Per Scotty Court, do not need second troponin.

## 2021-05-05 NOTE — ED Notes (Signed)
Pt to ED for fluttering in chest, denies CP and SOB. Pt has had previous episodes of SVT since 4/22 and has had stress tests with cardiologist that were inconclusive.  Edp at bedside.

## 2021-05-05 NOTE — ED Provider Notes (Addendum)
[facility] Emergency Department Provider Note  ____________________________________________  Time seen: Approximately 2:20 PM  I have reviewed the triage vital signs and the nursing notes.   HISTORY  Chief Complaint Tachycardia    HPI Roy Horton is a 45 y.o. male with a history of anxiety GERD and tachycardia who has previously undergone echocardiogram, Holter monitor, and electrophysiology evaluation due to suspected SVT who comes ED complaining of tachycardia that started this morning around breakfast.  He had already taken his daily Toprol XL, and when he started feeling his heart racing he took an additional dose of 25 mg.  No alcohol or smoking, no drug use, no excessive caffeine use.  He stays well-hydrated, no exertional symptoms or strenuous activity.  He does note that he is routinely very stressed at work.    Past Medical History:  Diagnosis Date  . Anxiety   . GERD (gastroesophageal reflux disease)    OCC  . History of concussion   . Hyperglycemia   . Migraines    improved with diet changes (cutting out MSG)  . Plantar fascia syndrome    s/p injection  . Tachycardia      Patient Active Problem List   Diagnosis Date Noted  . Daytime sleepiness 01/05/2021  . Apnea, sleep 01/05/2021  . Essential hypertension, benign 01/11/2020  . Routine general medical examination at a health care facility 05/15/2018  . Post-nasal drip 04/13/2017  . Grief reaction 06/03/2015  . Pain in joint, shoulder region 04/23/2015  . Tendonitis 04/23/2015  . Advance care planning 12/23/2014  . Hyperglycemia 12/23/2014     Past Surgical History:  Procedure Laterality Date  . SHOULDER ARTHROSCOPY WITH OPEN ROTATOR CUFF REPAIR Left 03/30/2016   Procedure: SHOULDER ARTHROSCOPY WITH OPEN ROTATOR CUFF PARTIAL THICKNESS REPAIR;  Surgeon: Christena Flake, MD;  Location: [facility] ORS;  Service: Orthopedics;  Laterality: Left;  . SHOULDER ARTHROSCOPY WITH SUBACROMIAL  DECOMPRESSION Left 03/30/2016   Procedure: SHOULDER ARTHROSCOPY WITH SUBACROMIAL DECOMPRESSION AND DEBRIDEMENT;  Surgeon: Christena Flake, MD;  Location: [facility] ORS;  Service: Orthopedics;  Laterality: Left;  . SVT ABLATION N/A 01/27/2021   Procedure: SVT ABLATION;  Surgeon: Lanier Prude, MD;  Location: [facility] INVASIVE CV LAB;  Service: Cardiovascular;  Laterality: N/A;  . WISDOM TOOTH EXTRACTION       Prior to Admission medications   Medication Sig Start Date End Date Taking? Authorizing Provider  acetaminophen (TYLENOL) 500 MG tablet Take 1,000 mg by mouth every 6 (six) hours as needed for moderate pain or headache.   Yes [provider]  ALPRAZolam Prudy Feeler) 0.5 MG tablet Take 1 tablet (0.5 mg total) by mouth daily as needed for anxiety (sedation caution). 12/19/20  Yes Joaquim Nam, MD  lisinopril (ZESTRIL) 10 MG tablet TAKE 1 TO 2 TABLETS(10 TO 20 MG) BY MOUTH DAILY 03/18/21  Yes Joaquim Nam, MD  metoprolol succinate (TOPROL XL) 50 MG 24 hr tablet Take 1 tablet (50 mg total) by mouth daily. Take with or immediately following a meal. 05/05/21 07/04/21 Yes Sharman Cheek, MD     Allergies Avocado   Family History  Problem Relation Age of Onset  . Heart disease Father   . Colon cancer Neg Hx   . Prostate cancer Neg Hx     Social History Social History   Tobacco Use  . Smoking status: Never  . Smokeless tobacco: Never  Substance Use Topics  . Alcohol use: No    Alcohol/week: 0.0 standard drinks  .  Drug use: No    Review of Systems  Constitutional:   No fever or chills.  ENT:   No sore throat. No rhinorrhea. Cardiovascular:   No chest pain or syncope.  Positive tachycardia Respiratory:   No dyspnea or cough. Gastrointestinal:   Negative for abdominal pain, vomiting and diarrhea.  Musculoskeletal:   Negative for focal pain or swelling All other systems reviewed and are negative except as documented above in ROS and  HPI.  ____________________________________________   PHYSICAL EXAM:  VITAL SIGNS: ED Triage Vitals  Enc Vitals Group     BP 05/05/21 1246 (!) 131/98     Pulse Rate 05/05/21 1246 (!) 170     Resp 05/05/21 1246 20     Temp 05/05/21 1246 98.5 F (36.9 C)     Temp Source 05/05/21 1246 Oral     SpO2 05/05/21 1246 98 %     Weight 05/05/21 1244 (!) 302 lb 14.6 oz (137.4 kg)     Height 05/05/21 1244 6\' 3"  (1.905 m)     Head Circumference --      Peak Flow --      Pain Score 05/05/21 1244 0     Pain Loc --      Pain Edu? --      Excl. in GC? --     Vital signs reviewed, nursing assessments reviewed.   Constitutional:   Alert and oriented. Non-toxic appearance. Eyes:   Conjunctivae are normal. EOMI. PERRL. ENT      Head:   Normocephalic and atraumatic.      Nose:   Wearing a mask.      Mouth/Throat:   Wearing a mask.      Neck:   No meningismus. Full ROM. Hematological/Lymphatic/Immunilogical:   No cervical lymphadenopathy. Cardiovascular:   Tachycardia heart rate 170. Symmetric bilateral radial and DP pulses.  No murmurs. Cap refill less than 2 seconds. Respiratory:   Normal respiratory effort without tachypnea/retractions. Breath sounds are clear and equal bilaterally. No wheezes/rales/rhonchi. Gastrointestinal:   Soft and nontender. Non distended. There is no CVA tenderness.  No rebound, rigidity, or guarding. Genitourinary:   deferred Musculoskeletal:   Normal range of motion in all extremities. No joint effusions.  No lower extremity tenderness.  No edema. Neurologic:   Normal speech and language.  Motor grossly intact. No acute focal neurologic deficits are appreciated.  Skin:    Skin is warm, dry and intact. No rash noted.  No petechiae, purpura, or bullae.  ____________________________________________    LABS (pertinent positives/negatives) (all labs ordered are listed, but only abnormal results are displayed) Labs Reviewed  COMPREHENSIVE METABOLIC PANEL -  Abnormal; Notable for the following components:      Result Value   Glucose, Bld 155 (*)    All other components within normal limits  TROPONIN I (HIGH SENSITIVITY) - Abnormal; Notable for the following components:   Troponin I (High Sensitivity) 27 (*)    All other components within normal limits  CBC WITH DIFFERENTIAL/PLATELET  MAGNESIUM  TSH  T4, FREE  TROPONIN I (HIGH SENSITIVITY)   ____________________________________________   EKG  Interpreted by me SVT, rate of 169.  Normal axis, normal QRS ST segments and T waves.  Repeat EKG obtained after giving 12 mg of IV adenosine shows underlying rhythm is atrial fibrillation.  Repeat EKG obtained after conversion back to normal sinus rhythm is unremarkable without ischemic changes.  ____________________________________________    RADIOLOGY  DG Chest Portable 1 View  Result Date: 05/05/2021  CLINICAL DATA:  Shortness of breath EXAM: PORTABLE CHEST 1 VIEW COMPARISON:  06/12/2015 FINDINGS: Normal heart size and mediastinal contours. No acute infiltrate or edema. No effusion or pneumothorax. No acute osseous findings. IMPRESSION: Negative portable chest. Electronically Signed   By: Tiburcio Pea M.D.   On: 05/05/2021 13:36    ____________________________________________   PROCEDURES .Critical Care Performed by: Sharman Cheek, MD Authorized by: Sharman Cheek, MD   Critical care provider statement:    Critical care time (minutes):  35   Critical care time was exclusive of:  Separately billable procedures and treating other patients   Critical care was necessary to treat or prevent imminent or life-threatening deterioration of the following conditions:  Cardiac failure and circulatory failure   Critical care was time spent personally by me on the following activities:  Development of treatment plan with patient or surrogate, discussions with consultants, evaluation of patient's response to treatment, examination of  patient, obtaining history from patient or surrogate, ordering and performing treatments and interventions, ordering and review of laboratory studies, ordering and review of radiographic studies, pulse oximetry, re-evaluation of patient's condition and review of old charts  ____________________________________________  DIFFERENTIAL DIAGNOSIS   Electrolyte abnormality, hyperthyroidism, intrinsic dysrhythmia, dehydration  CLINICAL IMPRESSION / ASSESSMENT AND PLAN / ED COURSE  Medications ordered in the ED: Medications  diltiazem (CARDIZEM) injection 25 mg (0 mg Intravenous Hold 05/05/21 1355)  metoprolol succinate (TOPROL-XL) 24 hr tablet 50 mg (has no administration in time range)  adenosine (ADENOCARD) 6 MG/2ML injection 6 mg (6 mg Intravenous Given 05/05/21 1314)  sodium chloride 0.9 % bolus 1,000 mL (0 mLs Intravenous Stopped 05/05/21 1352)  adenosine (ADENOCARD) 12 MG/4ML injection 12 mg (12 mg Intravenous Given 05/05/21 1319)    Pertinent labs & imaging results that were available during my care of the patient were reviewed by me and considered in my medical decision making (see chart for details).  Roy Horton was evaluated in Emergency Department on 05/05/2021 for the symptoms described in the history of present illness. He was evaluated in the context of the global COVID-19 pandemic, which necessitated consideration that the patient might be at risk for infection with the SARS-CoV-2 virus that causes COVID-19. Institutional protocols and algorithms that pertain to the evaluation of patients at risk for COVID-19 are in a state of rapid change based on information released by regulatory bodies including the CDC and federal and state organizations. These policies and algorithms were followed during the patient's care in the ED.     Clinical Course as of 05/05/21 1433  Thu May 05, 2021  1306 Patient presents with SVT, not relieved by taking an additional 25 mg of his metoprolol XL.   Blood pressures normal, no significant symptoms other than palpitations.  He has previously had an EP study which was nondiagnostic and is following up with [facility] cardiology/electrophysiology.    Will give IV adenosine and IV fluids [PS]  1329 12 mg of adenosine produced a brief pause which demonstrates atrial flutter as the underlying rhythm.  Still with a heart rate of 170, will consult his cardiologist, start diltiazem [PS]  1432 Initial troponin was 27 which I think is due to the extreme tachycardia.  He has no symptoms to suggest ACS PE dissection or heart failure, and repeat troponins are not indicated. [PS]    Clinical Course User Index [PS] Sharman Cheek, MD    ----------------------------------------- 2:22 PM on 05/05/2021 ----------------------------------------- Patient has converted back to normal sinus rhythm prior  to administration of diltiazem.  Blood pressure is 130/100, HR 90, will give an additional dose of metoprolol for today and write a new prescription to increase his Toprol-XL to 50 mg daily.  He will follow back up with Dr. Lalla Brothers for reassessment.   ____________________________________________   FINAL CLINICAL IMPRESSION(S) / ED DIAGNOSES    Final diagnoses:  Atrial flutter, unspecified type [facility])     ED Discharge Orders          Ordered    metoprolol succinate (TOPROL XL) 50 MG 24 hr tablet  Daily        05/05/21 1419            Portions of this note were generated with dragon dictation software. Dictation errors may occur despite best attempts at proofreading.    Sharman Cheek, MD 05/05/21 1423    Sharman Cheek, MD 05/05/21 [phone]

## 2021-05-09 MED ORDER — METOPROLOL SUCCINATE ER 50 MG PO TB24: 50.0000 mg | ORAL_TABLET | Freq: Every day | ORAL | 0 refills | Status: DC

## 2021-05-24 NOTE — Progress Notes (Signed)
Electrophysiology Office Follow up Visit Note:    Date:  05/25/2021   ID:  Roy Horton, DOB 06-Jan-1976, MRN [id number]  PCP:  Joaquim Nam, MD  [facility] Cardiologist:  Debbe Odea, MD  [facility] Electrophysiologist:  Lanier Prude, MD    Interval History:    Roy Horton is a 45 y.o. male who presents for a follow up visit. They were last seen in clinic March 16, 2021.  Since then he presented to the emergency department on May 05, 2021 with palpitations.  There, he was noted to be in atrial fibrillation/atrial flutter.  He presents today for follow-up.  He has not had another episode since leaving the hospital.  He continues to take Toprol-XL 50 mg by mouth daily.       Past Medical History:  Diagnosis Date   Anxiety    GERD (gastroesophageal reflux disease)    OCC   History of concussion    Hyperglycemia    Migraines    improved with diet changes (cutting out MSG)   Plantar fascia syndrome    s/p injection   Tachycardia     Past Surgical History:  Procedure Laterality Date   SHOULDER ARTHROSCOPY WITH OPEN ROTATOR CUFF REPAIR Left 03/30/2016   Procedure: SHOULDER ARTHROSCOPY WITH OPEN ROTATOR CUFF PARTIAL THICKNESS REPAIR;  Surgeon: Christena Flake, MD;  Location: [facility] ORS;  Service: Orthopedics;  Laterality: Left;   SHOULDER ARTHROSCOPY WITH SUBACROMIAL DECOMPRESSION Left 03/30/2016   Procedure: SHOULDER ARTHROSCOPY WITH SUBACROMIAL DECOMPRESSION AND DEBRIDEMENT;  Surgeon: Christena Flake, MD;  Location: [facility] ORS;  Service: Orthopedics;  Laterality: Left;   SVT ABLATION N/A 01/27/2021   Procedure: SVT ABLATION;  Surgeon: Lanier Prude, MD;  Location: [facility] INVASIVE CV LAB;  Service: Cardiovascular;  Laterality: N/A;   WISDOM TOOTH EXTRACTION      Current Medications: Current Meds  Medication Sig   acetaminophen (TYLENOL) 500 MG tablet Take 1,000 mg by mouth every 6 (six) hours as needed for moderate pain or headache.   ALPRAZolam  (XANAX) 0.5 MG tablet Take 1 tablet (0.5 mg total) by mouth daily as needed for anxiety (sedation caution).   flecainide (TAMBOCOR) 100 MG tablet Take 1 tablet (100 mg total) by mouth 2 (two) times daily.   hydrochlorothiazide (MICROZIDE) 12.5 MG capsule Take 1 capsule (12.5 mg total) by mouth daily.   lisinopril (ZESTRIL) 20 MG tablet Take 1 tablet (20 mg total) by mouth daily.   metoprolol succinate (TOPROL XL) 50 MG 24 hr tablet Take 1 tablet (50 mg total) by mouth daily. Take with or immediately following a meal.   [DISCONTINUED] lisinopril (ZESTRIL) 10 MG tablet TAKE 1 TO 2 TABLETS(10 TO 20 MG) BY MOUTH DAILY     Allergies:   Avocado   Social History   Socioeconomic History   Marital status: Married    Spouse name: Not on file   Number of children: Not on file   Years of education: Not on file   Highest education level: Not on file  Occupational History   Not on file  Tobacco Use   Smoking status: Never   Smokeless tobacco: Never  Substance and Sexual Activity   Alcohol use: No    Alcohol/week: 0.0 standard drinks   Drug use: No   Sexual activity: Not on file  Other Topics Concern   Not on file  Social History Narrative   Married 2001   Works at [employer]   Financial Resource Strain: Not on file  Food Insecurity: Not on file  Transportation Needs: Not on file  Physical Activity: Not on file  Stress: Not on file  Social Connections: Not on file     Family History: The patient's family history includes Heart disease in his father. There is no history of Colon cancer or Prostate cancer.  ROS:   Please see the history of present illness.    All other systems reviewed and are negative.  EKGs/Labs/Other Studies Reviewed:    The following studies were reviewed today:  05/05/2021 ECG Atrial fibrillation versus less likely atrial flutter   May 05, 2021 EKG #2 Atrial fibrillation    May 05, 2021  EKG Atrial fibrillation with slower ventricular response    EKG:  The ekg ordered today demonstrates sinus rhythm.  Normal intervals.  PR interval 158 ms.  QRS duration 86 ms.  Recent Labs: 05/05/2021: ALT 32; BUN 12; Creatinine, Ser 0.70; Hemoglobin 16.7; Magnesium 2.0; Platelets 232; Potassium 4.0; Sodium 140; TSH 1.469  Recent Lipid Panel    Component Value Date/Time   CHOL 151 03/11/2020 0000   CHOL 175 03/09/2017 0000   TRIG 90 03/11/2020 0000   HDL 34 03/09/2017 0000   LDLCALC 99 03/11/2020 0000    Physical Exam:    VS:  BP (!) 168/92 (BP Location: Left Arm, Patient Position: Sitting, Cuff Size: Normal)   Pulse 71   Ht 6\' 3"  (1.905 m)   Wt (!) 304 lb (137.9 kg)   SpO2 98%   BMI 38.00 kg/m     Wt Readings from Last 3 Encounters:  05/25/21 (!) 304 lb (137.9 kg)  05/05/21 (!) 302 lb 14.6 oz (137.4 kg)  03/16/21 (!) 303 lb (137.4 kg)     GEN:  Well nourished, well developed in no acute distress.  Obese HEENT: Normal NECK: No JVD; No carotid bruits LYMPHATICS: No lymphadenopathy CARDIAC: RRR, no murmurs, rubs, gallops RESPIRATORY:  Clear to auscultation without rales, wheezing or rhonchi  ABDOMEN: Soft, non-tender, non-distended MUSCULOSKELETAL:  No edema; No deformity  SKIN: Warm and dry NEUROLOGIC:  Alert and oriented x 3 PSYCHIATRIC:  Normal affect        ASSESSMENT:    1. Paroxysmal atrial fibrillation (HCC)   2. Obesity, unspecified classification, unspecified obesity type, unspecified whether serious comorbidity present   3. Encounter for monitoring flecainide therapy   4. Primary hypertension    PLAN:    In order of problems listed above:  1. Paroxysmal atrial fibrillation (HCC) New diagnosis.  His CHA2DS2-VASc is 1 for hypertension.  He should start an aspirin 81 mg by mouth once daily.  We discussed the options for rhythm control including antiarrhythmic drug therapy and catheter ablation.  Currently given his weight, he is not a candidate for  catheter ablation.  I would like him to lose 15 to 20 pounds before considering taking him to the lab for an ablation procedure.  We discussed the link between obesity and catheter ablation success rates during today's appointment.  I would like to start flecainide 100 mg by mouth twice daily in addition to his Toprol in an effort to maintain normal sinus rhythm.  We will get an exercise tolerance test in 7 to 10 days.  He will follow-up with me in 4 months.  In the interim, he will work on weight loss.  2. Obesity, unspecified classification, unspecified obesity type, unspecified whether serious comorbidity present Weight loss discussed at length during today's  appointment.  He will work on eliminating sweet tea, juices from his diet.  In the past he was able to lose weight.  He seems motivated.  3. Encounter for monitoring flecainide therapy New start  4. Primary hypertension Above goal today.  We will increase his lisinopril to 20 mg by mouth once daily and add hydrochlorothiazide 12.5 mg by mouth once daily.  I would like him to check his blood pressures at home 1-2 times per week.  We will get BMP and magnesium in 7 to 10 days when he presents for his ETT.  I have asked him to touch base with his primary care physician today to get an appointment in the next week or so to check back in on his blood pressures.  Follow-up with me in 4 months.     Medication Adjustments/Labs and Tests Ordered: Current medicines are reviewed at length with the patient today.  Concerns regarding medicines are outlined above.  Orders Placed This Encounter  Procedures   Basic Metabolic Panel (BMET)   Magnesium   Exercise Tolerance Test   EKG 12-Lead    Meds ordered this encounter  Medications   flecainide (TAMBOCOR) 100 MG tablet    Sig: Take 1 tablet (100 mg total) by mouth 2 (two) times daily.    Dispense:  60 tablet    Refill:  11   lisinopril (ZESTRIL) 20 MG tablet    Sig: Take 1 tablet (20 mg  total) by mouth daily.    Dispense:  90 tablet    Refill:  3   hydrochlorothiazide (MICROZIDE) 12.5 MG capsule    Sig: Take 1 capsule (12.5 mg total) by mouth daily.    Dispense:  90 capsule    Refill:  3      Signed, Steffanie Dunn, MD, [facility] 05/25/2021 9:18 AM    Electrophysiology [facility]

## 2021-05-25 ENCOUNTER — Other Ambulatory Visit: Payer: Self-pay

## 2021-05-25 ENCOUNTER — Encounter: Payer: Self-pay | Admitting: Cardiology

## 2021-05-25 ENCOUNTER — Ambulatory Visit (INDEPENDENT_AMBULATORY_CARE_PROVIDER_SITE_OTHER): Payer: Managed Care, Other (non HMO) | Admitting: Cardiology

## 2021-05-25 VITALS — BP 168/92 | HR 71 | Ht 75.0 in | Wt 304.0 lb

## 2021-05-25 DIAGNOSIS — E669 Obesity, unspecified: Secondary | ICD-10-CM | POA: Diagnosis not present

## 2021-05-25 DIAGNOSIS — Z5181 Encounter for therapeutic drug level monitoring: Secondary | ICD-10-CM

## 2021-05-25 DIAGNOSIS — Z79899 Other long term (current) drug therapy: Secondary | ICD-10-CM

## 2021-05-25 DIAGNOSIS — I10 Essential (primary) hypertension: Secondary | ICD-10-CM

## 2021-05-25 DIAGNOSIS — I480 Paroxysmal atrial fibrillation: Secondary | ICD-10-CM | POA: Diagnosis not present

## 2021-05-25 MED ORDER — LISINOPRIL 20 MG PO TABS: 20.0000 mg | ORAL_TABLET | Freq: Every day | ORAL | 3 refills | Status: DC

## 2021-05-25 MED ORDER — HYDROCHLOROTHIAZIDE 12.5 MG PO CAPS: 12.5000 mg | ORAL_CAPSULE | Freq: Every day | ORAL | 3 refills | Status: DC

## 2021-05-25 MED ORDER — FLECAINIDE ACETATE 100 MG PO TABS: 100.0000 mg | ORAL_TABLET | Freq: Two times a day (BID) | ORAL | 11 refills | Status: DC

## 2021-05-25 NOTE — Patient Instructions (Addendum)
Medication Instructions:  Your physician has recommended you make the following change in your medication:    START taking flecainide 100 mg-  Take one tablet by mouth twice a day  2.    START taking HCTZ (hydrochlorothiazide) 12.5 mg-  Take one tablet by mouth daily  3.   INCREASE your lisinopril-  Take 20 mg by mouth daily  (You may take 2 tablets of your 10 mg lisinopril until that prescription is gone)  *If you need a refill on your cardiac medications before your next appointment, please call your pharmacy*  Lab Work: You will get lab work the SAME day as your STRESS TEST.  Testing/Procedures: You will be scheduled for an exercise tolerance test in 7-10 days after starting flecainide.  Follow-Up: At [facility], you and your health needs are our priority.  As part of our continuing mission to provide you with exceptional heart care, we have created designated Provider Care Teams.  These Care Teams include your primary Cardiologist (physician) and Advanced Practice Providers (APPs -  Physician Assistants and Nurse Practitioners) who all work together to provide you with the care you need, when you need it.  Your next appointment:   Your physician wants you to follow-up in: 4 months with Dr. Lalla Brothers.  Exercise Treadmill Instructions:  - you may eat a light breakfast/ lunch prior to your procedure - no caffeine for 24 hours prior to your test (coffee, tea, soft drinks, or chocolate)  - no smoking/ vaping for 4 hours prior to your test - you WILL take your regular medications the day of your test except for:  - bring any inhalers with you to your test - wear comfortable clothing & tennis/ non-skid shoes to walk on the treadmill  Flecainide Tablets What is this medication? FLECAINIDE (FLEK a nide) prevents and treats a fast or irregular heartbeat (arrhythmia). It is often used to treat a type of arrhythmia known as AFib (atrial fibrillation). It works by slowing down overactive  electric signals in the heart, which stabilizes your heart rhythm. It belongs to a group of medications called antiarrhythmics. This medicine may be used for other purposes; ask your health care provider or pharmacist if you have questions. COMMON BRAND NAME(S): Tambocor What should I tell my care team before I take this medication? They need to know if you have any of these conditions: Abnormal levels of potassium in the blood Heart disease including heart rhythm and heart rate problems Kidney or liver disease Recent heart attack An unusual or allergic reaction to flecainide, local anesthetics, other medications, foods, dyes, or preservatives Pregnant or trying to get pregnant Breast-feeding How should I use this medication? Take this medication by mouth with a glass of water. Follow the directions on the prescription label. You can take this medication with or without food. Take your doses at regular intervals. Do not take your medication more often than directed. Do not stop taking this medication suddenly. This may cause serious, heart-related side effects. If your care team wants you to stop the medication, the dose may be slowly lowered over time to avoid any side effects. Talk to your care team regarding the use of this medication in children. While this medication may be prescribed for children as young as 1 year of age for selected conditions, precautions do apply. Overdosage: If you think you have taken too much of this medicine contact a poison control center or emergency room at once. NOTE: This medicine is only for you.  Do not share this medicine with others. What if I miss a dose? If you miss a dose, take it as soon as you can. If it is almost time for your next dose, take only that dose. Do not take double or extra doses. What may interact with this medication? Do not take this medication with any of the following: Amoxapine Arsenic trioxide Certain antibiotics like  clarithromycin, erythromycin, gatifloxacin, gemifloxacin, levofloxacin, moxifloxacin, sparfloxacin, or troleandomycin Certain antidepressants called tricyclic antidepressants like amitriptyline, imipramine, or nortriptyline Certain medications to control heart rhythm like disopyramide, encainide, moricizine, procainamide, propafenone, and quinidine Cisapride Delavirdine Droperidol Haloperidol [place]orn Imatinib Levomethadyl Maprotiline Medications for malaria like chloroquine and halofantrine Pentamidine Phenothiazines like chlorpromazine, mesoridazine, prochlorperazine, thioridazine Pimozide Quinine Ranolazine Ritonavir Sertindole This medication may also interact with the following: Cimetidine Dofetilide Medications for angina or high blood pressure Medications to control heart rhythm like amiodarone and digoxin Ziprasidone This list may not describe all possible interactions. Give your health care provider a list of all the medicines, herbs, non-prescription drugs, or dietary supplements you use. Also tell them if you smoke, drink alcohol, or use illegal drugs. Some items may interact with your medicine. What should I watch for while using this medication? Visit your care team for regular checks on your progress. Because your condition and the use of this medication carries some risk, it is a good idea to carry an identification card, necklace or bracelet with details of your condition, medications, and care team. Check your blood pressure and pulse rate regularly. Ask your care team what your blood pressure and pulse rate should be, and when you should contact them. Your care team also may schedule regular blood tests and electrocardiograms to check your progress. You may get drowsy or dizzy. Do not drive, use machinery, or do anything that needs mental alertness until you know how this medication affects you. Do not stand or sit up quickly, especially if you are an older patient.  This reduces the risk of dizzy or fainting spells. Alcohol can make you more dizzy, increase flushing and rapid heartbeats. Avoid alcoholic drinks. What side effects may I notice from receiving this medication? Side effects that you should report to your care team as soon as possible: Allergic reactions-skin rash, itching, hives, swelling of the face, lips, tongue, or throat Heart failure-shortness of breath, swelling of the ankles, feet, or hands, sudden weight gain, unusual weakness or fatigue Heart rhythm changes-fast or irregular heartbeat, dizziness, feeling faint or lightheaded, chest pain, trouble breathing Liver injury-right upper belly pain, loss of appetite, nausea, light-colored stool, dark yellow or brown urine, yellowing skin or eyes, unusual weakness or fatigue Side effects that usually do not require medical attention (report to your care team if they continue or are bothersome): Blurry vision Constipation Dizziness Fatigue Headache Nausea Tremors or shaking This list may not describe all possible side effects. Call your doctor for medical advice about side effects. You may report side effects to FDA at 1-800-FDA-1088. Where should I keep my medication? Keep out of the reach of children and pets. Store at room temperature between 15 and 30 degrees C (59 and 86 degrees F). Protect from light. Keep container tightly closed. Throw away any unused medication after the expiration date. NOTE: This sheet is a summary. It may not cover all possible information. If you have questions about this medicine, talk to your doctor, pharmacist, or health care provider.  2022 Elsevier/Gold Standard (2020-08-26 12:17:39)

## 2021-05-26 ENCOUNTER — Encounter: Payer: Self-pay | Admitting: Family Medicine

## 2021-05-27 ENCOUNTER — Ambulatory Visit: Payer: Managed Care, Other (non HMO) | Admitting: Family Medicine

## 2021-06-02 ENCOUNTER — Telehealth: Payer: Self-pay | Admitting: Cardiology

## 2021-06-02 NOTE — Telephone Encounter (Signed)
Attempted to call to remind of GXT Lmov to avoid caffeine and smoking for 24 hrs and DO NOT HOLD ANY MEDS !!!

## 2021-06-02 NOTE — Telephone Encounter (Signed)
GXT for 06/06/21. Nursing will attempt to call patient on 06/03/21 to follow up on pre-GXT instructions.

## 2021-06-03 NOTE — Telephone Encounter (Signed)
Spoke with pt.  Confirmed GXT (Flecainide start) appointment for Monday 06/06/21 at 9 AM.  Reminded pt to arrive at 8:30 AM for lab prior to GXT.  Reviewed instructions below:  Exercise Treadmill Instructions:   - you may eat a light breakfast/ lunch prior to your procedure - no caffeine for 24 hours prior to your test (coffee, tea, soft drinks, or chocolate)  - no smoking/ vaping for 4 hours prior to your test - you WILL take your regular medications the day of your test except for:  - bring any inhalers with you to your test - wear comfortable clothing & tennis/ non-skid shoes to walk on the treadmill  Pt voiced understanding and has no further questions at this time.

## 2021-06-06 ENCOUNTER — Other Ambulatory Visit: Payer: Managed Care, Other (non HMO)

## 2021-06-07 ENCOUNTER — Telehealth: Payer: Self-pay

## 2021-06-07 NOTE — Telephone Encounter (Signed)
Called patient and left a detailed VM per DPR on file with the following reminder and with his 3:00 appointment time.  Exercise Treadmill Instructions:   - you may eat a light breakfast/ lunch prior to your procedure - no caffeine for 24 hours prior to your test (coffee, tea, soft drinks, or chocolate)  - no smoking/ vaping for 4 hours prior to your test - you WILL take your regular medications the day of your test except for:  - bring any inhalers with you to your test - wear comfortable clothing & tennis/ non-skid shoes to walk on the treadmill

## 2021-06-08 ENCOUNTER — Ambulatory Visit (INDEPENDENT_AMBULATORY_CARE_PROVIDER_SITE_OTHER): Payer: Managed Care, Other (non HMO)

## 2021-06-08 ENCOUNTER — Other Ambulatory Visit: Payer: Self-pay

## 2021-06-08 ENCOUNTER — Other Ambulatory Visit (INDEPENDENT_AMBULATORY_CARE_PROVIDER_SITE_OTHER): Payer: Managed Care, Other (non HMO)

## 2021-06-08 DIAGNOSIS — I480 Paroxysmal atrial fibrillation: Secondary | ICD-10-CM

## 2021-06-08 DIAGNOSIS — I10 Essential (primary) hypertension: Secondary | ICD-10-CM | POA: Diagnosis not present

## 2021-06-08 DIAGNOSIS — Z79899 Other long term (current) drug therapy: Secondary | ICD-10-CM

## 2021-06-08 DIAGNOSIS — Z5181 Encounter for therapeutic drug level monitoring: Secondary | ICD-10-CM | POA: Diagnosis not present

## 2021-06-08 DIAGNOSIS — E669 Obesity, unspecified: Secondary | ICD-10-CM | POA: Diagnosis not present

## 2021-06-09 LAB — MAGNESIUM: Magnesium: 1.8 mg/dL (ref 1.6–2.3)

## 2021-06-09 LAB — BASIC METABOLIC PANEL
BUN/Creatinine Ratio: 15 (ref 9–20)
BUN: 11 mg/dL (ref 6–24)
CO2: 25 mmol/L (ref 20–29)
Calcium: 9.3 mg/dL (ref 8.7–10.2)
Chloride: 101 mmol/L (ref 96–106)
Creatinine, Ser: 0.75 mg/dL — ABNORMAL LOW (ref 0.76–1.27)
Glucose: 123 mg/dL — ABNORMAL HIGH (ref 70–99)
Potassium: 4.2 mmol/L (ref 3.5–5.2)
Sodium: 140 mmol/L (ref 134–144)
eGFR: 113 mL/min/{1.73_m2} (ref >59)

## 2021-06-09 LAB — EXERCISE TOLERANCE TEST
Angina Index: 0
Estimated workload: 10.1
Exercise duration (min): 8 min
Exercise duration (sec): 58 s
MPHR: 174 {beats}/min
Peak HR: 139 {beats}/min
Percent HR: 79 %
Rest HR: 71 {beats}/min

## 2021-06-29 ENCOUNTER — Telehealth: Payer: Self-pay

## 2021-06-29 NOTE — Patient Instructions (Signed)
Your sleep study order has expired. Call our office to schedule an office visit to re-evaluate the need for a sleep study.  Sincerely,  [facility] Sleep Team  

## 2021-06-29 NOTE — Telephone Encounter (Signed)
Letter has been sent to patient informing them that their sleep study has expired. Patient will need to call and schedule an office visit to re-evaluate the need for a sleep study.    

## 2021-08-04 ENCOUNTER — Other Ambulatory Visit: Payer: Self-pay | Admitting: Cardiology

## 2021-09-11 ENCOUNTER — Other Ambulatory Visit: Payer: Self-pay | Admitting: Family Medicine

## 2021-09-13 ENCOUNTER — Encounter: Payer: Self-pay | Admitting: Family Medicine

## 2021-09-13 ENCOUNTER — Telehealth (INDEPENDENT_AMBULATORY_CARE_PROVIDER_SITE_OTHER): Payer: Managed Care, Other (non HMO) | Admitting: Family Medicine

## 2021-09-13 ENCOUNTER — Other Ambulatory Visit: Payer: Self-pay

## 2021-09-13 DIAGNOSIS — J069 Acute upper respiratory infection, unspecified: Secondary | ICD-10-CM | POA: Insufficient documentation

## 2021-09-13 MED ORDER — AMOXICILLIN-POT CLAVULANATE 875-125 MG PO TABS: 1.0000 | ORAL_TABLET | Freq: Two times a day (BID) | ORAL | 0 refills | Status: DC

## 2021-09-13 NOTE — Telephone Encounter (Signed)
Please offer VV.  Thanks.

## 2021-09-13 NOTE — Progress Notes (Signed)
Interactive audio and video telecommunications were attempted between this provider and patient, however failed, due to patient having technical difficulties OR patient did not have access to video capability.  We continued and completed visit with audio only.   Virtual Visit via Telephone Note  I connected with patient on 09/13/21  at 3:16 PM  by telephone and verified that I am speaking with the correct person using two identifiers.  Location of patient: at work   Location of MD: [facility] Name of referring provider (if blank then none associated): Names per persons and role in encounter:  MD: Ferd Hibbs, Patient: name listed above.    I discussed the limitations, risks, security and privacy concerns of performing an evaluation and management service by telephone and the availability of in person appointments. I also discussed with the patient that there may be a patient responsible charge related to this service. The patient expressed understanding and agreed to proceed.  CC: cough  History of Present Illness:   Patient has had a cough, sinus congestion and possible pink eye (L eye irritation w/o discharge) starting today. Patient sx started Thursday. Patient has not done a covid test. Patient has been taking otc cold/flu medication.  He has been using pataday prev, when needed.  Wearing contacts at baseline.  He feels a little better today overall.  No fevers.  Rare sputum, occ noted in the AM right after getting up.  No wheeze.  Fatigue noted.  Postnasal gtt. He changed to wearing glasses today instead of contacts today.  Some episodic facial pain, sinus pain.    Observations/Objective:nad Speech wnl Occ dry cough during the conversation but not in distress and speaking in complete sentences.    Assessment and Plan: URI   D/w pt about options.  He could have a sinus infection but he is improving some in the meantime.  We talked about viral vs bacterial ddx and it would be  reasonable to hold abx for now.  If he has progressive sx, then start.  Routine cautions d/w pt. Supportive care o/w.  He can schedule a physical when possible.  He agrees with plan.   Follow Up Instructions: see above.     I discussed the assessment and treatment plan with the patient. The patient was provided an opportunity to ask questions and all were answered. The patient agreed with the plan and demonstrated an understanding of the instructions.   The patient was advised to call back or seek an in-person evaluation if the symptoms worsen or if the condition fails to improve as anticipated.  I provided 14 minutes of non-face-to-face time during this encounter.  Crawford Givens, MD

## 2021-09-13 NOTE — Assessment & Plan Note (Signed)
URI   D/w pt about options.  He could have a sinus infection but he is improving some in the meantime.  We talked about viral vs bacterial ddx and it would be reasonable to hold abx for now.  If he has progressive sx, then start.  Routine cautions d/w pt. Supportive care o/w.  He can schedule a physical when possible.  He agrees with plan.

## 2021-09-15 ENCOUNTER — Telehealth: Payer: Managed Care, Other (non HMO) | Admitting: Family Medicine

## 2021-09-19 ENCOUNTER — Encounter: Payer: Self-pay | Admitting: Family Medicine

## 2021-09-21 ENCOUNTER — Encounter: Payer: Self-pay | Admitting: Cardiology

## 2021-09-28 ENCOUNTER — Ambulatory Visit: Payer: Managed Care, Other (non HMO) | Admitting: Cardiology

## 2021-10-16 ENCOUNTER — Other Ambulatory Visit: Payer: Self-pay | Admitting: Family Medicine

## 2021-10-16 DIAGNOSIS — I10 Essential (primary) hypertension: Secondary | ICD-10-CM

## 2021-10-18 ENCOUNTER — Other Ambulatory Visit: Payer: Self-pay

## 2021-10-25 ENCOUNTER — Encounter: Payer: Self-pay | Admitting: Family Medicine

## 2021-11-01 ENCOUNTER — Encounter: Payer: Self-pay | Admitting: Family Medicine

## 2021-11-01 NOTE — Progress Notes (Signed)
?Electrophysiology Office Follow up Visit Note:   ? ?Date:  11/02/2021  ? ?ID:  Roy Horton, DOB 04-26-1976, MRN [id number] ? ?PCP:  Tonia Ghent, MD  ?[facility] Cardiologist:  Kate Sable, MD  ?[facility] Electrophysiologist:  Vickie Epley, MD  ? ? ?Interval History:   ? ?Roy Horton is a 46 y.o. male who presents for a follow up visit. They were last seen in clinic 05/25/2021 for AF.  He previously had an EP study on January 27, 2021 but there is no inducible arrhythmias during that procedure.  After that he presented to the hospital on May 05, 2021 with palpitations and was found to be in atrial fibrillation and flutter. ? ?He was started on flecainide at our last appointment.  ? ?He has been doing well.  His struggle with weight loss because of a COVID infection couple months ago.  He had a lot of lingering fatigue after his COVID infection.  He is still planning to lose weight and would long-term like to pursue ablation as a mechanism to avoid long-term exposure to antiarrhythmic drugs.  He tells me has not had significant breakthrough while on flecainide.  Is not currently on an anticoagulant because of a low CHA2DS2-VASc. ? ? ?  ? ?Past Medical History:  ?Diagnosis Date  ? Anxiety   ? GERD (gastroesophageal reflux disease)   ? OCC  ? History of concussion   ? Hyperglycemia   ? Migraines   ? improved with diet changes (cutting out MSG)  ? Plantar fascia syndrome   ? s/p injection  ? Tachycardia   ? ? ?Past Surgical History:  ?Procedure Laterality Date  ? SHOULDER ARTHROSCOPY WITH OPEN ROTATOR CUFF REPAIR Left 03/30/2016  ? Procedure: SHOULDER ARTHROSCOPY WITH OPEN ROTATOR CUFF PARTIAL THICKNESS REPAIR;  Surgeon: Corky Mull, MD;  Location: [facility] ORS;  Service: Orthopedics;  Laterality: Left;  ? SHOULDER ARTHROSCOPY WITH SUBACROMIAL DECOMPRESSION Left 03/30/2016  ? Procedure: SHOULDER ARTHROSCOPY WITH SUBACROMIAL DECOMPRESSION AND DEBRIDEMENT;  Surgeon: Corky Mull, MD;   Location: [facility] ORS;  Service: Orthopedics;  Laterality: Left;  ? SVT ABLATION N/A 01/27/2021  ? Procedure: SVT ABLATION;  Surgeon: Vickie Epley, MD;  Location: [facility];  Service: Cardiovascular;  Laterality: N/A;  ? WISDOM TOOTH EXTRACTION    ? ? ?Current Medications: ?Current Meds  ?Medication Sig  ? acetaminophen (TYLENOL) 500 MG tablet Take 1,000 mg by mouth every 6 (six) hours as needed for moderate pain or headache.  ? ALPRAZolam (XANAX) 0.5 MG tablet Take 1 tablet (0.5 mg total) by mouth daily as needed for anxiety (sedation caution).  ? aspirin 81 MG EC tablet Take 81 mg by mouth daily. Swallow whole.  ? flecainide (TAMBOCOR) 100 MG tablet Take 1 tablet (100 mg total) by mouth 2 (two) times daily.  ? hydrochlorothiazide (MICROZIDE) 12.5 MG capsule Take 1 capsule (12.5 mg total) by mouth daily.  ? lisinopril (ZESTRIL) 20 MG tablet Take 1 tablet (20 mg total) by mouth daily.  ? metoprolol succinate (TOPROL-XL) 50 MG 24 hr tablet TAKE 1 TABLET(50 MG) BY MOUTH DAILY WITH OR IMMEDIATELY FOLLOWING A MEAL  ?  ? ?Allergies:   Avocado  ? ?Social History  ? ?Socioeconomic History  ? Marital status: Married  ?  Spouse name: Not on file  ? Number of children: Not on file  ? Years of education: Not on file  ? Highest education level: Not on file  ?Occupational History  ?  Not on file  ?Tobacco Use  ? Smoking status: Never  ? Smokeless tobacco: Never  ?Substance and Sexual Activity  ? Alcohol use: No  ?  Alcohol/week: 0.0 standard drinks  ? Drug use: No  ? Sexual activity: Not on file  ?Other Topics Concern  ? Not on file  ?Social History Narrative  ? Married 2001  ? Works at [employer]- facilities.    ? ?Social Determinants of Health  ? ?Financial Resource Strain: Not on file  ?Food Insecurity: Not on file  ?Transportation Needs: Not on file  ?Physical Activity: Not on file  ?Stress: Not on file  ?Social Connections: Not on file  ?  ? ?Family History: ?The patient's family history includes Heart  disease in his father. There is no history of Colon cancer or Prostate cancer. ? ?ROS:   ?Please see the history of present illness.    ?All other systems reviewed and are negative. ? ?EKGs/Labs/Other Studies Reviewed:   ? ?The following studies were reviewed today: ? ? ?EKG:  The ekg ordered today demonstrates sinus rhythm.  PR interval 170 ms.  QRS duration 96 ms. ? ?Recent Labs: ?05/05/2021: ALT 32; Hemoglobin 16.7; Platelets 232; TSH 1.469 ?06/08/2021: BUN 11; Creatinine, Ser 0.75; Magnesium 1.8; Potassium 4.2; Sodium 140  ?Recent Lipid Panel ?   ?Component Value Date/Time  ? CHOL 151 03/11/2020 0000  ? CHOL 175 03/09/2017 0000  ? TRIG 90 03/11/2020 0000  ? HDL 34 03/09/2017 0000  ? [place] 99 03/11/2020 0000  ? ? ?Physical Exam:   ? ?VS:  BP 130/84 (BP Location: Left Arm, Patient Position: Sitting, Cuff Size: Large)   Pulse 63   Ht 6\' 3"  (1.905 m)   Wt (!) 309 lb (140.2 kg)   SpO2 97%   BMI 38.62 kg/m?    ? ?Wt Readings from Last 3 Encounters:  ?11/02/21 (!) 309 lb (140.2 kg)  ?09/13/21 (!) 301 lb (136.5 kg)  ?05/25/21 (!) 304 lb (137.9 kg)  ?  ? ?GEN:  Well nourished, well developed in no acute distress.  Obese ?HEENT: Normal ?NECK: No JVD; No carotid bruits ?LYMPHATICS: No lymphadenopathy ?CARDIAC: RRR, no murmurs, rubs, gallops ?RESPIRATORY:  Clear to auscultation without rales, wheezing or rhonchi  ?ABDOMEN: Soft, non-tender, non-distended ?MUSCULOSKELETAL:  No edema; No deformity  ?SKIN: Warm and dry ?NEUROLOGIC:  Alert and oriented x 3 ?PSYCHIATRIC:  Normal affect  ? ? ? ?  ? ?ASSESSMENT:   ? ?1. Paroxysmal atrial fibrillation (HCC)   ?2. Obesity, unspecified classification, unspecified obesity type, unspecified whether serious comorbidity present   ?3. Encounter for long-term (current) use of high-risk medication   ? ?PLAN:   ? ?In order of problems listed above: ? ?#Paroxysmal atrial fibrillation ?Overall good control on flecainide and metoprolol.  Continue current therapy.  EKGs okay today.  He will  follow-up in 6 months with an APP for EKG and to reassess his symptom burden and weight.  If he is lost weight (less than 300 pounds) and is still interested in pursuing ablation we can get this scheduled for him. ? ?#Obesity ?Weight loss encouraged.  Discussed link between success rates of atrial fibrillation on ablation and obesity during today's appointment. ? ?Follow-up 6 months with APP.  EKG at that visit. ? ?Medication Adjustments/Labs and Tests Ordered: ?Current medicines are reviewed at length with the patient today.  Concerns regarding medicines are outlined above.  ?No orders of the defined types were placed in this encounter. ? ?No  orders of the defined types were placed in this encounter. ? ? ? ?Signed, ?Lars Mage, MD, [facility], [facility] ?11/02/2021 10:30 AM    ?Electrophysiology ?[place] ?

## 2021-11-02 ENCOUNTER — Ambulatory Visit: Payer: BC Managed Care – PPO | Admitting: Cardiology

## 2021-11-02 ENCOUNTER — Encounter: Payer: Self-pay | Admitting: Cardiology

## 2021-11-02 ENCOUNTER — Other Ambulatory Visit: Payer: Self-pay

## 2021-11-02 VITALS — BP 130/84 | HR 63 | Ht 75.0 in | Wt 309.0 lb

## 2021-11-02 DIAGNOSIS — Z79899 Other long term (current) drug therapy: Secondary | ICD-10-CM | POA: Diagnosis not present

## 2021-11-02 DIAGNOSIS — E669 Obesity, unspecified: Secondary | ICD-10-CM | POA: Diagnosis not present

## 2021-11-02 DIAGNOSIS — I480 Paroxysmal atrial fibrillation: Secondary | ICD-10-CM | POA: Diagnosis not present

## 2021-11-02 NOTE — Patient Instructions (Signed)
Medications: ?Your physician recommends that you continue on your current medications as directed. Please refer to the Current Medication list given to you today. ?*If you need a refill on your cardiac medications before your next appointment, please call your pharmacy* ? ?Lab Work: ?None. ?If you have labs (blood work) drawn today and your tests are completely normal, you will receive your results only by: ?MyChart Message (if you have MyChart) OR ?A paper copy in the mail ?If you have any lab test that is abnormal or we need to change your treatment, we will call you to review the results. ? ?Testing/Procedures: ?None. ? ?Follow-Up: ?At [facility], you and your health needs are our priority.  As part of our continuing mission to provide you with exceptional heart care, we have created designated Provider Care Teams.  These Care Teams include your primary Cardiologist (physician) and Advanced Practice Providers (APPs -  Physician Assistants and Nurse Practitioners) who all work together to provide you with the care you need, when you need it. ? ?Your physician wants you to follow-up in: 6 months with Lars Mage or APP.  ? ?We recommend signing up for the patient portal called "MyChart".  Sign up information is provided on this After Visit Summary.  MyChart is used to connect with patients for Virtual Visits (Telemedicine).  Patients are able to view lab/test results, encounter notes, upcoming appointments, etc.  Non-urgent messages can be sent to your provider as well.   ?To learn more about what you can do with MyChart, go to [email or web address].   ? ?Any Other Special Instructions Will Be Listed Below (If Applicable). ? ?

## 2021-11-04 ENCOUNTER — Encounter: Payer: Self-pay | Admitting: Family Medicine

## 2021-11-08 ENCOUNTER — Encounter: Payer: Self-pay | Admitting: Family Medicine

## 2022-05-02 NOTE — Progress Notes (Unsigned)
Electrophysiology Office Follow up Visit Note:    Date:  05/03/2022   ID:  REX OESTERLE, DOB 1975-09-30, MRN [id number]  PCP:  Joaquim Nam, MD  [facility] Cardiologist:  Debbe Odea, MD  [facility] Electrophysiologist:  Lanier Prude, MD    Interval History:    Roy Horton is a 46 y.o. male who presents for a follow up visit. They were last seen in clinic 11/02/2021 for AF. He is on flecainide and metoprolol. He is not on [facility] given a low CHADSVASc.  He tells me that he is done well with his heart rhythm.  No sustained episodes of atrial fibrillation.  He continues to tolerate the flecainide.  He tells me that he is just now starting to get over his COVID illness and lingering fatigue from his COVID infection earlier in 2023.     Past Medical History:  Diagnosis Date   Anxiety    GERD (gastroesophageal reflux disease)    OCC   History of concussion    Hyperglycemia    Migraines    improved with diet changes (cutting out MSG)   Plantar fascia syndrome    s/p injection   Tachycardia     Past Surgical History:  Procedure Laterality Date   SHOULDER ARTHROSCOPY WITH OPEN ROTATOR CUFF REPAIR Left 03/30/2016   Procedure: SHOULDER ARTHROSCOPY WITH OPEN ROTATOR CUFF PARTIAL THICKNESS REPAIR;  Surgeon: Christena Flake, MD;  Location: [facility] ORS;  Service: Orthopedics;  Laterality: Left;   SHOULDER ARTHROSCOPY WITH SUBACROMIAL DECOMPRESSION Left 03/30/2016   Procedure: SHOULDER ARTHROSCOPY WITH SUBACROMIAL DECOMPRESSION AND DEBRIDEMENT;  Surgeon: Christena Flake, MD;  Location: [facility] ORS;  Service: Orthopedics;  Laterality: Left;   SVT ABLATION N/A 01/27/2021   Procedure: SVT ABLATION;  Surgeon: Lanier Prude, MD;  Location: [facility];  Service: Cardiovascular;  Laterality: N/A;   WISDOM TOOTH EXTRACTION      Current Medications: Current Meds  Medication Sig   acetaminophen (TYLENOL) 500 MG tablet Take 1,000 mg by mouth every 6 (six) hours as  needed for moderate pain or headache.   ALPRAZolam (XANAX) 0.5 MG tablet Take 1 tablet (0.5 mg total) by mouth daily as needed for anxiety (sedation caution).   aspirin 81 MG EC tablet Take 81 mg by mouth daily. Swallow whole.   flecainide (TAMBOCOR) 100 MG tablet Take 1 tablet (100 mg total) by mouth 2 (two) times daily.   hydrochlorothiazide (MICROZIDE) 12.5 MG capsule Take 1 capsule (12.5 mg total) by mouth daily.   lisinopril (ZESTRIL) 20 MG tablet Take 1 tablet (20 mg total) by mouth daily.   metoprolol succinate (TOPROL-XL) 50 MG 24 hr tablet TAKE 1 TABLET(50 MG) BY MOUTH DAILY WITH OR IMMEDIATELY FOLLOWING A MEAL     Allergies:   Avocado   Social History   Socioeconomic History   Marital status: Married    Spouse name: Not on file   Number of children: Not on file   Years of education: Not on file   Highest education level: Not on file  Occupational History   Not on file  Tobacco Use   Smoking status: Never   Smokeless tobacco: Never  Substance and Sexual Activity   Alcohol use: No    Alcohol/week: 0.0 standard drinks of alcohol   Drug use: No   Sexual activity: Not on file  Other Topics Concern   Not on file  Social History Narrative   Married 2001   Works at [employer]  [employer] of [employer]- facilities.     Social Determinants of Health   Financial Resource Strain: Not on file  Food Insecurity: Not on file  Transportation Needs: Not on file  Physical Activity: Not on file  Stress: Not on file  Social Connections: Not on file     Family History: The patient's family history includes Heart disease in his father. There is no history of Colon cancer or Prostate cancer.  ROS:   Please see the history of present illness.    All other systems reviewed and are negative.  EKGs/Labs/Other Studies Reviewed:    The following studies were reviewed today:   EKG:  The ekg ordered today demonstrates sinus rhythm.  PR interval 194 ms.  QRS duration 98 ms.  Recent  Labs: 05/05/2021: ALT 32; Hemoglobin 16.7; Platelets 232; TSH 1.469 06/08/2021: BUN 11; Creatinine, Ser 0.75; Magnesium 1.8; Potassium 4.2; Sodium 140  Recent Lipid Panel    Component Value Date/Time   CHOL 151 03/11/2020 0000   CHOL 175 03/09/2017 0000   TRIG 90 03/11/2020 0000   HDL 34 03/09/2017 0000   LDLCALC 99 03/11/2020 0000    Physical Exam:    VS:  BP (!) 142/84 (BP Location: Left Arm, Patient Position: Sitting, Cuff Size: Large)   Pulse 63   Ht 6\' 4"  (1.93 m)   Wt (!) 320 lb (145.2 kg)   SpO2 98%   BMI 38.95 kg/m     Wt Readings from Last 3 Encounters:  05/03/22 (!) 320 lb (145.2 kg)  11/02/21 (!) 309 lb (140.2 kg)  09/13/21 (!) 301 lb (136.5 kg)     GEN:  Well nourished, well developed in no acute distress HEENT: Normal NECK: No JVD; No carotid bruits LYMPHATICS: No lymphadenopathy CARDIAC: RRR, no murmurs, rubs, gallops RESPIRATORY:  Clear to auscultation without rales, wheezing or rhonchi  ABDOMEN: Soft, non-tender, non-distended MUSCULOSKELETAL:  No edema; No deformity  SKIN: Warm and dry NEUROLOGIC:  Alert and oriented x 3 PSYCHIATRIC:  Normal affect        ASSESSMENT:    1. Paroxysmal atrial fibrillation (HCC)   2. Encounter for long-term (current) use of high-risk medication    PLAN:    In order of problems listed above:   #pAF On flecainide and metoprolol, doing well.  CHA2DS2-VASc Score = 1  The patient's score is based upon: CHF History: 0 HTN History: 1 Diabetes History: 0 Stroke History: 0 Vascular Disease History: 0 Age Score: 0 Gender Score: 0   #Obesity Weight loss encouraged  #Hypertension Systolic blood pressures are 140 and 142 on today's checks.  Ideally he would average less than 140 mmHg.   Continue lisinopril.  Continue hydrochlorothiazide.  I have encouraged him to check his blood pressures at home and record these values to bring to his primary care office for ongoing adjustment of his medications.  F/u 6 mo  with APP.  Medication Adjustments/Labs and Tests Ordered: Current medicines are reviewed at length with the patient today.  Concerns regarding medicines are outlined above.  Orders Placed This Encounter  Procedures   EKG 12-Lead   No orders of the defined types were placed in this encounter.    Signed, Lars Mage, MD, [facility], [facility] 05/03/2022 8:15 AM    Electrophysiology [facility]

## 2022-05-03 ENCOUNTER — Ambulatory Visit: Payer: BC Managed Care – PPO | Attending: Cardiology | Admitting: Cardiology

## 2022-05-03 ENCOUNTER — Encounter: Payer: Self-pay | Admitting: Cardiology

## 2022-05-03 VITALS — BP 142/84 | HR 63 | Ht 76.0 in | Wt 320.0 lb

## 2022-05-03 DIAGNOSIS — I480 Paroxysmal atrial fibrillation: Secondary | ICD-10-CM | POA: Diagnosis not present

## 2022-05-03 DIAGNOSIS — Z79899 Other long term (current) drug therapy: Secondary | ICD-10-CM | POA: Diagnosis not present

## 2022-05-03 NOTE — Progress Notes (Deleted)
This encounter was created in error - please disregard.

## 2022-05-03 NOTE — Patient Instructions (Signed)
Medication Instructions:  none *If you need a refill on your cardiac medications before your next appointment, please call your pharmacy*   Lab Work: none If you have labs (blood work) drawn today and your tests are completely normal, you will receive your results only by: [place] (if you have MyChart) OR A paper copy in the mail If you have any lab test that is abnormal or we need to change your treatment, we will call you to review the results.   Testing/Procedures: none   Follow-Up: At [facility], you and your health needs are our priority.  As part of our continuing mission to provide you with exceptional heart care, we have created designated Provider Care Teams.  These Care Teams include your primary Cardiologist (physician) and Advanced Practice Providers (APPs -  Physician Assistants and Nurse Practitioners) who all work together to provide you with the care you need, when you need it.  We recommend signing up for the patient portal called "MyChart".  Sign up information is provided on this After Visit Summary.  MyChart is used to connect with patients for Virtual Visits (Telemedicine).  Patients are able to view lab/test results, encounter notes, upcoming appointments, etc.  Non-urgent messages can be sent to your provider as well.   To learn more about what you can do with MyChart, go to [email or web address].    Your next appointment:   6 month(s)  The format for your next appointment:   In Person  Provider:   You will see one of the following Advanced Practice Providers on your designated Care Team:   Tommye Standard, [place] Legrand Como "Jonni Sanger" Chalmers Cater, [place]       Important Information About Sugar

## 2022-05-10 ENCOUNTER — Other Ambulatory Visit: Payer: Self-pay | Admitting: Cardiology

## 2022-05-10 DIAGNOSIS — Z5181 Encounter for therapeutic drug level monitoring: Secondary | ICD-10-CM

## 2022-05-10 DIAGNOSIS — I10 Essential (primary) hypertension: Secondary | ICD-10-CM

## 2022-05-10 DIAGNOSIS — E669 Obesity, unspecified: Secondary | ICD-10-CM

## 2022-05-10 DIAGNOSIS — I480 Paroxysmal atrial fibrillation: Secondary | ICD-10-CM

## 2022-05-15 ENCOUNTER — Other Ambulatory Visit: Payer: Self-pay | Admitting: Cardiology

## 2022-05-15 DIAGNOSIS — Z5181 Encounter for therapeutic drug level monitoring: Secondary | ICD-10-CM

## 2022-05-15 DIAGNOSIS — I10 Essential (primary) hypertension: Secondary | ICD-10-CM

## 2022-05-15 DIAGNOSIS — I480 Paroxysmal atrial fibrillation: Secondary | ICD-10-CM

## 2022-05-15 DIAGNOSIS — E669 Obesity, unspecified: Secondary | ICD-10-CM

## 2022-06-05 ENCOUNTER — Encounter: Payer: Self-pay | Admitting: Family Medicine

## 2022-06-05 ENCOUNTER — Ambulatory Visit (INDEPENDENT_AMBULATORY_CARE_PROVIDER_SITE_OTHER): Payer: BC Managed Care – PPO | Admitting: Family Medicine

## 2022-06-05 VITALS — BP 118/84 | HR 77 | Temp 97.8°F | Ht 76.0 in | Wt 321.0 lb

## 2022-06-05 DIAGNOSIS — I10 Essential (primary) hypertension: Secondary | ICD-10-CM

## 2022-06-05 DIAGNOSIS — Z0000 Encounter for general adult medical examination without abnormal findings: Secondary | ICD-10-CM

## 2022-06-05 DIAGNOSIS — F4321 Adjustment disorder with depressed mood: Secondary | ICD-10-CM

## 2022-06-05 DIAGNOSIS — I480 Paroxysmal atrial fibrillation: Secondary | ICD-10-CM

## 2022-06-05 DIAGNOSIS — Z7189 Other specified counseling: Secondary | ICD-10-CM

## 2022-06-05 MED ORDER — ALPRAZOLAM 0.5 MG PO TABS: 0.5000 mg | ORAL_TABLET | Freq: Every day | ORAL | 0 refills | Status: DC | PRN

## 2022-06-05 MED ORDER — LORATADINE 10 MG PO TABS: 10.0000 mg | ORAL_TABLET | Freq: Every day | ORAL | Status: AC

## 2022-06-05 NOTE — Progress Notes (Unsigned)
CPE- See plan.  Routine anticipatory guidance given to patient.  See health maintenance.  The possibility exists that previously documented standard health maintenance information may have been brought forward from a previous encounter into this note.  If needed, that same information has been updated to reflect the current situation based on today's encounter.    Tetanus 2017 Flu 2023 PNA not due.  Shingles not due.   Covid vaccine prev done.   Colon and prostate cancer screening not due.  Living will d/w pt.  Wife designated if patient were incapacitated.  [facility].     Labs discussed with patient.  PAF.  Had cards f/u.   Using medication without problems or lightheadedness: yes Chest pain with exertion:no Edema:no Short of breath:no No recent skipped beats.   We talked about using claritin but not claritin D for seasonal allergies.   ACE cautions d/w pt.    Rare use of xanax.  Cautions d/w pt.  He is working at [employer]- that is where he had to go to identify his mother after she died, ie at [employer].  D/w pt.  He is managing and as the years go by he has adjusted to the changes in his life.  He had covid earlier this year.  He finally got back to his baseline.  Prev wheeze resolved.    PMH and SH reviewed  Meds, vitals, and allergies reviewed.   ROS: Per HPI.  Unless specifically indicated otherwise in HPI, the patient denies:  General: fever. Eyes: acute vision changes ENT: sore throat Cardiovascular: chest pain Respiratory: SOB GI: vomiting GU: dysuria Musculoskeletal: acute back pain Derm: acute rash Neuro: acute motor dysfunction Psych: worsening mood Endocrine: polydipsia Heme: bleeding Allergy: hayfever  GEN: nad, alert and oriented HEENT: ncat NECK: supple w/o LA CV: rrr. PULM: ctab, no inc wob ABD: soft, +bs EXT: no edema SKIN: no acute rash

## 2022-06-05 NOTE — Patient Instructions (Addendum)
Come by for fasting labs tomorrow AM.  Ask the front about seeing that up.   Take care.  Glad to see you. Thanks for your effort.

## 2022-06-06 ENCOUNTER — Other Ambulatory Visit: Payer: Self-pay | Admitting: Family Medicine

## 2022-06-06 ENCOUNTER — Telehealth: Payer: Self-pay | Admitting: Family Medicine

## 2022-06-06 ENCOUNTER — Other Ambulatory Visit (INDEPENDENT_AMBULATORY_CARE_PROVIDER_SITE_OTHER): Payer: BC Managed Care – PPO

## 2022-06-06 DIAGNOSIS — R739 Hyperglycemia, unspecified: Secondary | ICD-10-CM

## 2022-06-06 DIAGNOSIS — I10 Essential (primary) hypertension: Secondary | ICD-10-CM

## 2022-06-06 DIAGNOSIS — I480 Paroxysmal atrial fibrillation: Secondary | ICD-10-CM | POA: Insufficient documentation

## 2022-06-06 LAB — CBC WITH DIFFERENTIAL/PLATELET
Basophils Absolute: 0 10*3/uL (ref 0.0–0.1)
Basophils Relative: 0.3 % (ref 0.0–3.0)
Eosinophils Absolute: 0.2 10*3/uL (ref 0.0–0.7)
Eosinophils Relative: 3.2 % (ref 0.0–5.0)
HCT: 40.7 % (ref 39.0–52.0)
Hemoglobin: 13.9 g/dL (ref 13.0–17.0)
Lymphocytes Relative: 36.7 % (ref 12.0–46.0)
Lymphs Abs: 2.5 10*3/uL (ref 0.7–4.0)
MCHC: 34.1 g/dL (ref 30.0–36.0)
MCV: 85.2 fl (ref 78.0–100.0)
Monocytes Absolute: 0.4 10*3/uL (ref 0.1–1.0)
Monocytes Relative: 5.6 % (ref 3.0–12.0)
Neutro Abs: 3.7 10*3/uL (ref 1.4–7.7)
Neutrophils Relative %: 54.2 % (ref 43.0–77.0)
Platelets: 197 10*3/uL (ref 150.0–400.0)
RBC: 4.78 Mil/uL (ref 4.22–5.81)
RDW: 12.6 % (ref 11.5–15.5)
WBC: 6.8 10*3/uL (ref 4.0–10.5)

## 2022-06-06 LAB — TSH: TSH: 1.67 u[IU]/mL (ref 0.35–5.50)

## 2022-06-06 LAB — COMPREHENSIVE METABOLIC PANEL
ALT: 43 U/L (ref 0–53)
AST: 29 U/L (ref 0–37)
Albumin: 4.1 g/dL (ref 3.5–5.2)
Alkaline Phosphatase: 48 U/L (ref 39–117)
BUN: 13 mg/dL (ref 6–23)
CO2: 28 mEq/L (ref 19–32)
Calcium: 9.2 mg/dL (ref 8.4–10.5)
Chloride: 101 mEq/L (ref 96–112)
Creatinine, Ser: 0.64 mg/dL (ref 0.40–1.50)
GFR: 112.8 mL/min (ref >60.00)
Glucose, Bld: 165 mg/dL — ABNORMAL HIGH (ref 70–99)
Potassium: 4 mEq/L (ref 3.5–5.1)
Sodium: 138 mEq/L (ref 135–145)
Total Bilirubin: 0.6 mg/dL (ref 0.2–1.2)
Total Protein: 6.8 g/dL (ref 6.0–8.3)

## 2022-06-06 LAB — LDL CHOLESTEROL, DIRECT: Direct LDL: 110 mg/dL

## 2022-06-06 LAB — LIPID PANEL
Cholesterol: 184 mg/dL (ref 0–200)
HDL: 34.5 mg/dL — ABNORMAL LOW (ref >39.00)
NonHDL: 149.83
Total CHOL/HDL Ratio: 5
Triglycerides: 222 mg/dL — ABNORMAL HIGH (ref 0.0–149.0)
VLDL: 44.4 mg/dL — ABNORMAL HIGH (ref 0.0–40.0)

## 2022-06-06 NOTE — Assessment & Plan Note (Signed)
Rare use of xanax.  Cautions d/w pt.  He is working at [employer]- that is where he had to go to identify his mother after she died, ie at [employer] [facility] [employer] [facility] [employer] [employer].  D/w pt.  He is managing and as the years go by he has adjusted to the changes in his life.

## 2022-06-06 NOTE — Assessment & Plan Note (Signed)
Living will d/w pt.  Wife designated if patient were incapacitated.   ?

## 2022-06-06 NOTE — Assessment & Plan Note (Signed)
Per cardiology.  I will defer. 

## 2022-06-06 NOTE — Assessment & Plan Note (Signed)
Tetanus 2017 Flu 2023 PNA not due.  Shingles not due.   Covid vaccine prev done.   Colon and prostate cancer screening not due.  Living will d/w pt.  Wife designated if patient were incapacitated.  [facility] and HCV [facility] ~[facility].

## 2022-06-06 NOTE — Telephone Encounter (Signed)
Please call patient.  I accidentally omitted one topic at the office visit yesterday but this is not an emergency.  In terms of colon cancer screening, the guideline has been adjusted so that it is reasonable to consider colon cancer screening at his age now.  When I was talking with the patient yesterday I was mistakenly thinking that he was still under age 46.  At this point it would be reasonable to consider options for colon cancer screening.  Since he has no family history he can either do stool cards, Cologuard, or colonoscopy.  If he wants a low false positive rate and also minimize the risk of the procedure, then Cologuard would be reasonable.  If he wants the most information possible, then it would make sense to proceed with a colonoscopy.  He does not have to make a decision right now and he can decide what he wants to do and let me know at any point.  I apologize for not addressing this yesterday.  Let me know if he has questions.  Thanks.

## 2022-06-06 NOTE — Assessment & Plan Note (Signed)
Continue hydrochlorothiazide lisinopril and metoprolol.  Return for follow-up labs.

## 2022-06-07 NOTE — Telephone Encounter (Signed)
LMTCB

## 2022-06-07 NOTE — Telephone Encounter (Signed)
Patient notified of below. He will call back once he decides which he would like to do.

## 2022-06-09 ENCOUNTER — Other Ambulatory Visit: Payer: BC Managed Care – PPO

## 2022-06-12 ENCOUNTER — Other Ambulatory Visit (INDEPENDENT_AMBULATORY_CARE_PROVIDER_SITE_OTHER): Payer: BC Managed Care – PPO

## 2022-06-12 DIAGNOSIS — R739 Hyperglycemia, unspecified: Secondary | ICD-10-CM

## 2022-06-12 LAB — HEMOGLOBIN A1C: Hgb A1c MFr Bld: 7.6 % — ABNORMAL HIGH (ref 4.6–6.5)

## 2022-06-14 ENCOUNTER — Encounter: Payer: Self-pay | Admitting: Family Medicine

## 2022-06-14 DIAGNOSIS — E119 Type 2 diabetes mellitus without complications: Secondary | ICD-10-CM | POA: Insufficient documentation

## 2022-06-16 ENCOUNTER — Telehealth: Payer: Self-pay | Admitting: Family Medicine

## 2022-06-16 NOTE — Telephone Encounter (Signed)
See result note for further documentation.

## 2022-06-16 NOTE — Telephone Encounter (Signed)
Patient called to get lab results and stated he was returning a call.

## 2022-08-24 ENCOUNTER — Ambulatory Visit
Admission: EM | Admit: 2022-08-24 | Discharge: 2022-08-24 | Disposition: A | Discharge status: Home / Self Care | Payer: BC Managed Care – PPO | Attending: Internal Medicine | Admitting: Internal Medicine

## 2022-08-24 ENCOUNTER — Encounter: Payer: Self-pay | Admitting: Emergency Medicine

## 2022-08-24 DIAGNOSIS — M5431 Sciatica, right side: Secondary | ICD-10-CM | POA: Diagnosis not present

## 2022-08-24 MED ORDER — DEXAMETHASONE SODIUM PHOSPHATE 10 MG/ML IJ SOLN
10.0000 mg | Freq: Once | INTRAMUSCULAR | Status: AC
  Administered 2022-08-24: 10 mg via INTRAMUSCULAR

## 2022-08-24 MED ORDER — METHOCARBAMOL 500 MG PO TABS: 500.0000 mg | ORAL_TABLET | Freq: Two times a day (BID) | ORAL | 0 refills | Status: DC

## 2022-08-24 MED ORDER — TRAMADOL HCL 50 MG PO TABS: 50.0000 mg | ORAL_TABLET | Freq: Three times a day (TID) | ORAL | 0 refills | Status: DC | PRN

## 2022-08-24 NOTE — Discharge Instructions (Addendum)
Gentle stretching exercises Please take medications as prescribed Please use heating pad on a 20-minute on-20 minutes off cycle If symptoms worsen please return to urgent care to be reevaluated.

## 2022-08-24 NOTE — ED Triage Notes (Signed)
Pt has chronic back issues and presents with right side back pain x 2 days.

## 2022-08-24 NOTE — ED Provider Notes (Signed)
[facility]    CSN: [id number] Arrival date & time: 08/24/22  0803      History   Chief Complaint Chief Complaint  Patient presents with   Back Pain    HPI Roy Horton is a 47 y.o. male history of lower back pain comes to urgent care with a 2-day history of right-sided low back pain of moderate severity.  Pain is throbbing, severe and aggravated by movement.  It is associated with tightness in the lower back.  No falls or trauma to the back.  Patient has taken a dose of ibuprofen and an old prescription of muscle relaxants.  It did not help his back pain.  No falls or trauma.  Pain radiates into the right leg.  He has been using a heating pad with no significant improvement in pain.  No numbness or tingling in the right lower extremity.  No weakness in the right lower extremity.  HPI  Past Medical History:  Diagnosis Date   Anxiety    GERD (gastroesophageal reflux disease)    OCC   History of concussion    Hyperglycemia    Migraines    improved with diet changes (cutting out MSG)   Plantar fascia syndrome    s/p injection   Tachycardia     Patient Active Problem List   Diagnosis Date Noted   Diabetes mellitus without complication (HCC) 06/14/2022   PAF (paroxysmal atrial fibrillation) (HCC) 06/06/2022   Daytime sleepiness 01/05/2021   Apnea, sleep 01/05/2021   Essential hypertension, benign 01/11/2020   Routine general medical examination at a health care facility 05/15/2018   Grief reaction 06/03/2015   Pain in joint, shoulder region 04/23/2015   Advance care planning 12/23/2014   Hyperglycemia 12/23/2014    Past Surgical History:  Procedure Laterality Date   SHOULDER ARTHROSCOPY WITH OPEN ROTATOR CUFF REPAIR Left 03/30/2016   Procedure: SHOULDER ARTHROSCOPY WITH OPEN ROTATOR CUFF PARTIAL THICKNESS REPAIR;  Surgeon: Christena Flake, MD;  Location: [facility];  Service: Orthopedics;  Laterality: Left;   SHOULDER ARTHROSCOPY WITH SUBACROMIAL  DECOMPRESSION Left 03/30/2016   Procedure: SHOULDER ARTHROSCOPY WITH SUBACROMIAL DECOMPRESSION AND DEBRIDEMENT;  Surgeon: Christena Flake, MD;  Location: [facility];  Service: Orthopedics;  Laterality: Left;   SVT ABLATION N/A 01/27/2021   Procedure: SVT ABLATION;  Surgeon: Lanier Prude, MD;  Location: [facility] INVASIVE CV LAB;  Service: Cardiovascular;  Laterality: N/A;   WISDOM TOOTH EXTRACTION         Home Medications    Prior to Admission medications   Medication Sig Start Date End Date Taking? Authorizing Provider  methocarbamol (ROBAXIN) 500 MG tablet Take 1 tablet (500 mg total) by mouth 2 (two) times daily. 08/24/22  Yes Joie Reamer, Britta Mccreedy, MD  traMADol (ULTRAM) 50 MG tablet Take 1 tablet (50 mg total) by mouth every 8 (eight) hours as needed. 08/24/22  Yes Amari Burnsworth, Britta Mccreedy, MD  acetaminophen (TYLENOL) 500 MG tablet Take 1,000 mg by mouth every 6 (six) hours as needed for moderate pain or headache.    [provider]  ALPRAZolam Prudy Feeler) 0.5 MG tablet Take 1 tablet (0.5 mg total) by mouth daily as needed for anxiety (sedation caution). 06/05/22   Joaquim Nam, MD  aspirin 81 MG EC tablet Take 81 mg by mouth daily. Swallow whole.    [provider]  flecainide (TAMBOCOR) 100 MG tablet TAKE 1 TABLET(100 MG) BY MOUTH TWICE DAILY 05/10/22   Lanier Prude, MD  hydrochlorothiazide (MICROZIDE)  12.5 MG capsule TAKE 1 CAPSULE(12.5 MG) BY MOUTH DAILY 05/10/22   Vickie Epley, MD  lisinopril (ZESTRIL) 20 MG tablet TAKE 1 TABLET(20 MG) BY MOUTH DAILY 05/15/22   Vickie Epley, MD  loratadine (CLARITIN) 10 MG tablet Take 1 tablet (10 mg total) by mouth daily. 06/05/22   Tonia Ghent, MD  metoprolol succinate (TOPROL-XL) 50 MG 24 hr tablet TAKE 1 TABLET(50 MG) BY MOUTH DAILY WITH OR IMMEDIATELY FOLLOWING A MEAL 08/04/21   Vickie Epley, MD    Family History Family History  Problem Relation Age of Onset   Heart disease Father    Colon cancer Neg Hx    Prostate  cancer Neg Hx     Social History Social History   Tobacco Use   Smoking status: Never   Smokeless tobacco: Never  Vaping Use   Vaping Use: Never used  Substance Use Topics   Alcohol use: No    Alcohol/week: 0.0 standard drinks of alcohol   Drug use: No     Allergies   Avocado   Review of Systems Review of Systems As per HPI  Physical Exam Triage Vital Signs ED Triage Vitals  Enc Vitals Group     BP 08/24/22 0837 125/71     Pulse Rate 08/24/22 0837 60     Resp 08/24/22 0837 18     Temp 08/24/22 0837 98 F (36.7 C)     Temp src --      SpO2 08/24/22 0837 100 %     Weight --      Height --      Head Circumference --      Peak Flow --      Pain Score 08/24/22 0835 6     Pain Loc --      Pain Edu? --      Excl. in [place]? --    No data found.  Updated Vital Signs BP 125/71 (BP Location: Left Arm)   Pulse 60   Temp 98 F (36.7 C)   Resp 18   SpO2 100%   Visual Acuity Right Eye Distance:   Left Eye Distance:   Bilateral Distance:    Right Eye Near:   Left Eye Near:    Bilateral Near:     Physical Exam Vitals and nursing note reviewed.  Constitutional:      General: He is not in acute distress.    Appearance: He is not ill-appearing.  Cardiovascular:     Rate and Rhythm: Normal rate and regular rhythm.  Musculoskeletal:        General: Normal range of motion.     Comments: Straight leg raise test is positive  Skin:    General: Skin is warm.  Neurological:     Mental Status: He is alert.      UC Treatments / Results  Labs (all labs ordered are listed, but only abnormal results are displayed) Labs Reviewed - No data to display  EKG   Radiology No results found.  Procedures Procedures (including critical care time)  Medications Ordered in UC Medications  dexamethasone (DECADRON) injection 10 mg (10 mg Intramuscular Given 08/24/22 0904)    Initial Impression / Assessment and Plan / UC Course  I have reviewed the triage vital signs  and the nursing notes.  Pertinent labs & imaging results that were available during my care of the patient were reviewed by me and considered in my medical decision making (see chart for details).  1.  Right-sided sciatica: Heating pad use on a 20-minute on-20 minutes off cycle Robaxin 500 mg twice daily Gentle stretching exercises Tramadol every 8 hours as needed for pain Medication precautions given Return precautions given No indication for imaging at this time. Final Clinical Impressions(s) / UC Diagnoses   Final diagnoses:  Sciatica of right side     Discharge Instructions      Gentle stretching exercises Please take medications as prescribed Please use heating pad on a 20-minute on-20 minutes off cycle If symptoms worsen please return to urgent care to be reevaluated.   ED Prescriptions     Medication Sig Dispense Auth. Provider   methocarbamol (ROBAXIN) 500 MG tablet Take 1 tablet (500 mg total) by mouth 2 (two) times daily. 20 tablet Seraphim Trow, Myrene Galas, MD   traMADol (ULTRAM) 50 MG tablet Take 1 tablet (50 mg total) by mouth every 8 (eight) hours as needed. 15 tablet Kaelani Kendrick, Myrene Galas, MD      I have reviewed the PDMP during this encounter.   Chase Picket, MD 08/24/22 [id number]

## 2022-09-18 ENCOUNTER — Ambulatory Visit: Payer: BC Managed Care – PPO | Admitting: Family Medicine

## 2022-09-18 ENCOUNTER — Encounter: Payer: Self-pay | Admitting: Family Medicine

## 2022-09-18 VITALS — BP 118/70 | HR 63 | Temp 97.8°F | Ht 76.0 in | Wt 305.0 lb

## 2022-09-18 DIAGNOSIS — M549 Dorsalgia, unspecified: Secondary | ICD-10-CM | POA: Insufficient documentation

## 2022-09-18 DIAGNOSIS — E119 Type 2 diabetes mellitus without complications: Secondary | ICD-10-CM

## 2022-09-18 DIAGNOSIS — R739 Hyperglycemia, unspecified: Secondary | ICD-10-CM

## 2022-09-18 LAB — POCT GLYCOSYLATED HEMOGLOBIN (HGB A1C): Hemoglobin A1C: 6.6 % — AB (ref 4.0–5.6)

## 2022-09-18 MED ORDER — METHOCARBAMOL 500 MG PO TABS: 500.0000 mg | ORAL_TABLET | Freq: Three times a day (TID) | ORAL | 1 refills | Status: DC | PRN

## 2022-09-18 NOTE — Assessment & Plan Note (Addendum)
A1c sig better, d/w pt.  A1c may continue to improve with diet, A1c may lag his progress at this point . Recheck in about 3-4 months with A1c at the visit.  If lightheaded, then stop HCTZ.  Update me as needed.   He agrees.  Continue work on diet.  I thanked him for his effort.  No change in meds.  Defer DM maintenance items as he may not be diabetic on next check.

## 2022-09-18 NOTE — Progress Notes (Signed)
Diabetes:  No meds.  Hypoglycemic episodes: no sx unless prolonged fasting and that is rare Hyperglycemic episodes: no sx Feet problems:no Blood Sugars averaging: not checked.   A1c d/w pt at OV.  A1c clearly better.   Intentional weight loss with diet. He is cutting out carbs.  He cut out soda.  D/w pt about exercise.    He had eval re: sciatica at [facility].  He is better in the meantime.  Discussed having robaxin on hand in case he had sx.  No ADE on med.  We talked about seeing the spine clinic if needed.  He'll update me as needed.    Meds, vitals, and allergies reviewed.  ROS: Per HPI unless specifically indicated in ROS section   GEN: nad, alert and oriented HEENT: ncat NECK: supple w/o LA CV: rrr. PULM: ctab, no inc wob ABD: soft, +bs EXT: no edema SKIN: well perfused.

## 2022-09-18 NOTE — Assessment & Plan Note (Signed)
D/w pt.  He can update me if you back is hurting more.  Rx sent for robaxin to have on hand, in case of flare in the future.

## 2022-09-18 NOTE — Patient Instructions (Addendum)
Update me if you back is hurting more.  Thanks for your effort.  Take care.  Glad to see you. Recheck in about 3-4 months with A1c at the visit.  If lightheaded, then stop HCTZ.  Update me as needed.

## 2022-09-25 ENCOUNTER — Other Ambulatory Visit: Payer: Self-pay | Admitting: Cardiology

## 2022-10-30 NOTE — Progress Notes (Unsigned)
Cardiology Office Note Date:  11/01/2022  Patient ID:  Roy Horton 08-15-1975, MRN [id number] PCP:  Tonia Ghent, MD  Cardiologist:  Kate Sable, MD Electrophysiologist: Vickie Epley, MD   Chief Complaint: 6 mon follow-up  History of Present Illness: Roy Horton is a 47 y.o. male with PMH notable for parox Afib, HTN, obesity; seen today for Vickie Epley, MD for routine electrophysiology followup.  He last saw Dr. Quentin Ore 04/2022, good Afib control, slightly hypertensive in office.   Since last being seen in our clinic the patient reports doing very well. Has not had any symptomatic AF episodes. He will occasional review his heart rhythm on his watch and notices that his HR has been elevated, but no symptoms to correlate with rhythm.  Taking flecainide BID without missed doses. Taking metop in AM. Having some evening fatigue. He was recently diagnosed as pre-diabetic at PCP office. Made changes to diet and A1C has already dropped by one point. Trying to be more active  Does not check BP regularly at home, but when he does, BP is 110-low 130s. BP is typically elevated in our office d/t family receiving cancer treatment in same facility.  He works at [employer].   he denies chest pain, palpitations, dyspnea, PND, orthopnea, nausea, vomiting, dizziness, syncope, edema, weight gain, or early satiety.     Past Medical History:  Diagnosis Date   Anxiety    GERD (gastroesophageal reflux disease)    OCC   History of concussion    Hyperglycemia    Migraines    improved with diet changes (cutting out MSG)   Plantar fascia syndrome    s/p injection   Tachycardia     Past Surgical History:  Procedure Laterality Date   SHOULDER ARTHROSCOPY WITH OPEN ROTATOR CUFF REPAIR Left 03/30/2016   Procedure: SHOULDER ARTHROSCOPY WITH OPEN ROTATOR CUFF PARTIAL THICKNESS REPAIR;  Surgeon: Corky Mull, MD;  Location: [facility];  Service: Orthopedics;   Laterality: Left;   SHOULDER ARTHROSCOPY WITH SUBACROMIAL DECOMPRESSION Left 03/30/2016   Procedure: SHOULDER ARTHROSCOPY WITH SUBACROMIAL DECOMPRESSION AND DEBRIDEMENT;  Surgeon: Corky Mull, MD;  Location: [facility];  Service: Orthopedics;  Laterality: Left;   SVT ABLATION N/A 01/27/2021   Procedure: SVT ABLATION;  Surgeon: Vickie Epley, MD;  Location: [facility];  Service: Cardiovascular;  Laterality: N/A;   WISDOM TOOTH EXTRACTION      Current Outpatient Medications  Medication Instructions   acetaminophen (TYLENOL) 1,000 mg, Oral, Every 6 hours PRN   ALPRAZolam (XANAX) 0.5 mg, Oral, Daily PRN   aspirin EC 81 mg, Oral, Daily, Swallow whole.   flecainide (TAMBOCOR) 100 MG tablet TAKE 1 TABLET(100 MG) BY MOUTH TWICE DAILY   hydrochlorothiazide (MICROZIDE) 12.5 MG capsule TAKE 1 CAPSULE(12.5 MG) BY MOUTH DAILY   lisinopril (ZESTRIL) 20 MG tablet TAKE 1 TABLET(20 MG) BY MOUTH DAILY   loratadine (CLARITIN) 10 mg, Oral, Daily   methocarbamol (ROBAXIN) 500 mg, Oral, Every 8 hours PRN   metoprolol succinate (TOPROL-XL) 50 mg, Oral, Daily    Social History:  The patient  reports that he has never smoked. He has never used smokeless tobacco. He reports that he does not drink alcohol and does not use drugs.   Family History:  The patient's family history includes Heart disease in his father.  ROS:  Please see the history of present illness. All other systems are reviewed and otherwise negative.   PHYSICAL EXAM:  VS:  BP (!) 130/90   Pulse 61   Ht 6\' 4"  (1.93 m)   Wt (!) 305 lb (138.3 kg)   SpO2 98%   BMI 37.13 kg/m  BMI: Body mass index is 37.13 kg/m.  GEN- The patient is well appearing, alert and oriented x 3 today.   Lungs- Clear to ausculation bilaterally, normal work of breathing.   Heart- Regular rate and rhythm, no murmurs, rubs or gallops, PMI not laterally displaced GI- soft, non-tender, non-distended, bowel sounds present, no hepatosplenomegaly Extremities- No  peripheral edema. no clubbing or cyanosis; DP/PT/radial pulses 2+ bilaterally MS- no significant deformity or atrophy Skin- warm and dry, no rash or lesion,  Psych- euthymic mood, full affect Neuro- strength and sensation are intact  EKG is ordered. Personal review of EKG from today shows:  NSR, rate 61, PR interval 198; QRS 100    Recent Labs: 06/06/2022: ALT 43; BUN 13; Creatinine, Ser 0.64; Hemoglobin 13.9; Platelets 197.0; Potassium 4.0; Sodium 138; TSH 1.67  06/06/2022: Cholesterol 184; Direct LDL 110.0; HDL 34.50; Total CHOL/HDL Ratio 5; Triglycerides 222.0; VLDL 44.4   CrCl cannot be calculated (Patient's most recent lab result is older than the maximum 21 days allowed.).   Wt Readings from Last 3 Encounters:  11/01/22 (!) 305 lb (138.3 kg)  09/18/22 (!) 305 lb (138.3 kg)  06/05/22 (!) 321 lb (145.6 kg)     Additional studies reviewed include: Previous EP, cardiology notes.   ETT, 06/08/2021 Normal exercise capacity. Peak METS 10.1. No exercise induced arrhythmias related to flecainide. No evidence of ischemia.  TTE 01/20/21  1. Left ventricular ejection fraction, by estimation, is 60 to 65%. The left ventricle has normal function. The left ventricle has no regional wall motion abnormalities. Left ventricular diastolic parameters were normal.   2. Right ventricular systolic function is normal. The right ventricular size is normal.   3. Left atrial size was mildly dilated.   4. The mitral valve is normal in structure. No evidence of mitral valve regurgitation. No evidence of mitral stenosis.   5. The aortic valve is normal in structure. Aortic valve regurgitation is not visualized. No aortic stenosis is present.   6. The inferior vena cava is normal in size with greater than 50% respiratory variability, suggesting right atrial pressure of 3 mmHg.   ASSESSMENT AND PLAN:  #) parox Afib Well-controlled on flecainide + metop Stable EKG intervals from prior Having some  evening fatigue - discussed taking metop at night to see if this helps. Can also consider lowering metop dose if continues to be problematic  CHA2DS2-VASc Score = 1 [CHF History: 0, HTN History: 1, Diabetes History: 0, Stroke History: 0, Vascular Disease History: 0, Age Score: 0, Gender Score: 0].  Therefore, the patient's annual risk of stroke is 0.6 %. [place] - not on [place] with low ChadsVasc, cont to monitor A1C at PCP.       #) HTN Slightly HTN in clinic today Was normotensive at PCP office last month.  Encouraged patient to continue to check BP a couple times a week at home and notify PCP if readings are consistently elevated.  #) Hyperglycemia Discussed that if hyperglycemia becomes T2DM, will need to add [place].  Continue dietary modifications, increase physical exercise   Current medicines are reviewed at length with the patient today.   The patient does not have concerns regarding his medicines.  The following changes were made today:  none  Labs/ tests ordered today include:  Orders Placed This Encounter  Procedures   EKG 12-Lead     Disposition: Follow up with Dr. Quentin Ore or EP APP in in 6 months   Signed, Mamie Levers, NP  11/01/22  9:36 AM  Electrophysiology [facility]

## 2022-11-01 ENCOUNTER — Ambulatory Visit: Payer: BC Managed Care – PPO | Attending: Cardiology | Admitting: Cardiology

## 2022-11-01 ENCOUNTER — Ambulatory Visit: Payer: BC Managed Care – PPO | Admitting: Cardiology

## 2022-11-01 ENCOUNTER — Encounter: Payer: Self-pay | Admitting: Cardiology

## 2022-11-01 VITALS — BP 130/90 | HR 61 | Ht 76.0 in | Wt 305.0 lb

## 2022-11-01 DIAGNOSIS — I480 Paroxysmal atrial fibrillation: Secondary | ICD-10-CM | POA: Diagnosis not present

## 2022-11-01 DIAGNOSIS — R739 Hyperglycemia, unspecified: Secondary | ICD-10-CM

## 2022-11-01 DIAGNOSIS — I10 Essential (primary) hypertension: Secondary | ICD-10-CM | POA: Diagnosis not present

## 2022-11-01 NOTE — Patient Instructions (Signed)
Medication Instructions:  Your physician recommends that you continue on your current medications as directed. Please refer to the Current Medication list given to you today.  *If you need a refill on your cardiac medications before your next appointment, please call your pharmacy*  Follow-Up: At [facility], you and your health needs are our priority.  As part of our continuing mission to provide you with exceptional heart care, we have created designated Provider Care Teams.  These Care Teams include your primary Cardiologist (physician) and Advanced Practice Providers (APPs -  Physician Assistants and Nurse Practitioners) who all work together to provide you with the care you need, when you need it.  Your next appointment:   6 month(s)  Provider:   You may see Vickie Epley, MD or one of the following Advanced Practice Providers on your designated Care Team:   Mamie Levers, NP

## 2022-11-01 NOTE — Addendum Note (Signed)
Addended by: Mamie Levers on: 11/01/2022 09:41 AM   Modules accepted: Orders

## 2022-11-06 ENCOUNTER — Other Ambulatory Visit: Payer: Self-pay | Admitting: Cardiology

## 2022-11-06 DIAGNOSIS — I10 Essential (primary) hypertension: Secondary | ICD-10-CM

## 2022-11-06 DIAGNOSIS — Z5181 Encounter for therapeutic drug level monitoring: Secondary | ICD-10-CM

## 2022-11-06 DIAGNOSIS — I480 Paroxysmal atrial fibrillation: Secondary | ICD-10-CM

## 2022-11-06 DIAGNOSIS — E669 Obesity, unspecified: Secondary | ICD-10-CM

## 2023-01-03 NOTE — Progress Notes (Unsigned)
This encounter was created in error - please disregard.  This encounter was created in error - please disregard.

## 2023-01-18 ENCOUNTER — Ambulatory Visit: Payer: BC Managed Care – PPO | Admitting: Family Medicine

## 2023-02-19 ENCOUNTER — Encounter: Payer: Self-pay | Admitting: Family Medicine

## 2023-02-19 ENCOUNTER — Ambulatory Visit: Payer: BC Managed Care – PPO | Admitting: Family Medicine

## 2023-02-19 VITALS — BP 122/80 | HR 71 | Temp 98.2°F | Ht 76.0 in | Wt 307.0 lb

## 2023-02-19 DIAGNOSIS — M79673 Pain in unspecified foot: Secondary | ICD-10-CM

## 2023-02-19 DIAGNOSIS — E119 Type 2 diabetes mellitus without complications: Secondary | ICD-10-CM | POA: Diagnosis not present

## 2023-02-19 LAB — MICROALBUMIN / CREATININE URINE RATIO
Creatinine,U: 43.7 mg/dL
Microalb Creat Ratio: 1.6 mg/g (ref 0.0–30.0)
Microalb, Ur: <0.7 mg/dL (ref 0.0–1.9)

## 2023-02-19 LAB — POCT GLYCOSYLATED HEMOGLOBIN (HGB A1C): Hemoglobin A1C: 7 % — AB (ref 4.0–5.6)

## 2023-02-19 MED ORDER — METHOCARBAMOL 500 MG PO TABS: 500.0000 mg | ORAL_TABLET | Freq: Three times a day (TID) | ORAL | 1 refills | Status: DC | PRN

## 2023-02-19 NOTE — Progress Notes (Unsigned)
Diabetes:  No meds.   Hypoglycemic episodes: no sx unless really prolonged fasting.   Hyperglycemic episodes: no sx Feet problems: see below.   Blood Sugars averaging: not checked.  eye exam within last year: yes, [facility] [place] A1c 7.  Higher than prev, he was expecting his A1c to increase some.   He had to put his dog down, etc.  He had a lot going on.   D/w pt about statin indication if he remains diabetic.    R foot pain. No L foot pain.  Heel pain, pain with first step out of bed.  D/w pt about podiatry eval.  He had been icing and using supports.    Meds, vitals, and allergies reviewed.  ROS: Per HPI unless specifically indicated in ROS section   GEN: nad, alert and oriented HEENT: ncat NECK: supple w/o LA CV: rrr. PULM: ctab, no inc wob ABD: soft, +bs EXT: no edema SKIN: well perfused.   Diabetic foot exam: Normal inspection No skin breakdown No calluses  Normal DP pulses Normal sensation to light touch and monofilament Nails normal  The 10-year ASCVD risk score (Arnett DK, et al., 2019) is: 7.7%   Values used to calculate the score:     Age: 47 years     Sex: Male     Is Non-Hispanic African American: No     Diabetic: Yes     Tobacco smoker: No     Systolic Blood Pressure: 122 mmHg     Is BP treated: Yes     HDL Cholesterol: 34.5 mg/dL     Total Cholesterol: 184 mg/dL

## 2023-02-19 NOTE — Patient Instructions (Addendum)
Recheck labs before a physical in November.  Take care.  Glad to see you. You should get a call about podiatry.

## 2023-02-20 LAB — HM DIABETES EYE EXAM

## 2023-02-21 DIAGNOSIS — M79673 Pain in unspecified foot: Secondary | ICD-10-CM | POA: Insufficient documentation

## 2023-02-21 NOTE — Assessment & Plan Note (Signed)
Refer to podiatry for likely plantar fasciitis.

## 2023-02-21 NOTE — Assessment & Plan Note (Signed)
A1c 7.  Higher than prev, he was expecting his A1c to increase some.   He had to put his dog down, etc.  He had a lot going on.   D/w pt about statin indication if he remains diabetic.   Continue work on diet and exercise. Recheck labs before a physical in November.

## 2023-03-07 ENCOUNTER — Ambulatory Visit: Payer: BC Managed Care – PPO | Admitting: Podiatry

## 2023-03-07 ENCOUNTER — Ambulatory Visit (INDEPENDENT_AMBULATORY_CARE_PROVIDER_SITE_OTHER): Payer: BC Managed Care – PPO

## 2023-03-07 ENCOUNTER — Encounter: Payer: Self-pay | Admitting: Podiatry

## 2023-03-07 DIAGNOSIS — M722 Plantar fascial fibromatosis: Secondary | ICD-10-CM | POA: Diagnosis not present

## 2023-03-07 NOTE — Progress Notes (Signed)
  Subjective:  Patient ID: Roy Horton, male    DOB: 1976-06-18,  MRN: [id number]  Chief Complaint  Patient presents with   Foot Pain    "I have what I assume is Plantar Fasciitis, I have pain in my heel." N - heel pain L - plantar right D - 3-4 weeks O - suddenly, about the same C - feels like a hot poker is sticking in it A - walking, getting up in the morning T - stretching and ice    47 y.o. male presents with the above complaint. History confirmed with patient.  Previously had injections which helped quite a bit  Objective:  Physical Exam: warm, good capillary refill, no trophic changes or ulcerative lesions, normal DP and PT pulses, and normal sensory exam. Left Foot: normal exam, no swelling, tenderness, instability; ligaments intact, full range of motion of all ankle/foot joints Right Foot: point tenderness over the heel pad and gastrocnemius equinus is noted with a positive silverskiold test  No images are attached to the encounter.  Radiographs: Multiple views x-ray of the right foot: no fracture, dislocation, swelling or degenerative changes noted and plantar calcaneal spur Assessment:   1. Plantar fasciitis of right foot      Plan:  Patient was evaluated and treated and all questions answered.  Discussed the etiology and treatment options for plantar fasciitis including stretching, formal physical therapy, supportive shoegears such as a running shoe or sneaker, pre fabricated orthoses, injection therapy, and oral medications. We also discussed the role of surgical treatment of this for patients who do not improve after exhausting non-surgical treatment options.   -XR reviewed with patient -Educated patient on stretching and icing of the affected limb -Injection delivered to the plantar fascia of the right foot. -He has appropriate shoe gear and activity modification, cannot take NSAIDs due to his hypertension and history of GERD. -I do think would benefit  from a custom molded functional orthotic.  This should help support the medial longitudinal arch and offload the plantar fascia.  He was scanned and casted for this today   Return in about 6 weeks (around 04/18/2023) for recheck plantar fasciitis.

## 2023-03-07 NOTE — Patient Instructions (Signed)

## 2023-03-14 ENCOUNTER — Encounter: Payer: Self-pay | Admitting: Podiatry

## 2023-03-15 MED ORDER — METHYLPREDNISOLONE 4 MG PO TBPK: ORAL_TABLET | ORAL | 0 refills | Status: DC

## 2023-04-18 ENCOUNTER — Encounter: Payer: Self-pay | Admitting: Podiatry

## 2023-04-18 ENCOUNTER — Ambulatory Visit: Payer: BC Managed Care – PPO | Admitting: Podiatry

## 2023-04-18 DIAGNOSIS — M722 Plantar fascial fibromatosis: Secondary | ICD-10-CM

## 2023-04-18 NOTE — Progress Notes (Signed)
  Subjective:  Patient ID: Roy Horton, male    DOB: 1975-12-04,  MRN: [id number]  Chief Complaint  Patient presents with   Plantar Fasciitis    "It's better, still some discomfort, especially when I get up in the morning or after sitting."    47 y.o. male presents with the above complaint. History confirmed with patient.  About 70 to 75% better  Objective:  Physical Exam: warm, good capillary refill, no trophic changes or ulcerative lesions, normal DP and PT pulses, and normal sensory exam. Left Foot: normal exam, no swelling, tenderness, instability; ligaments intact, full range of motion of all ankle/foot joints Right Foot: point tenderness over the heel pad and gastrocnemius equinus is noted with a positive silverskiold test  No images are attached to the encounter.  Radiographs: Multiple views x-ray of the right foot: no fracture, dislocation, swelling or degenerative changes noted and plantar calcaneal spur Assessment:   1. Plantar fasciitis of right foot      Plan:  Patient was evaluated and treated and all questions answered.  Has had significant improvement, injection therapy helped quite a bit.  Considering much he is on his feet I did recommend a second injection to eliminate this.  His orthotics were not here today and we will notify him when they are ready for pickup, hopefully should offer a good long-term prevention.  After sterile prep with povidone-iodine solution and alcohol, the right heel was injected with 0.5cc 2% xylocaine plain, 0.5cc 0.5% marcaine plain, 5mg  triamcinolone acetonide, and 2mg  dexamethasone was injected along the medial plantar fascia at the insertion on the plantar calcaneus. The patient tolerated the procedure well without complication.   Return if symptoms worsen or fail to improve.

## 2023-05-02 ENCOUNTER — Other Ambulatory Visit: Payer: Self-pay

## 2023-05-02 DIAGNOSIS — I480 Paroxysmal atrial fibrillation: Secondary | ICD-10-CM

## 2023-05-02 DIAGNOSIS — E669 Obesity, unspecified: Secondary | ICD-10-CM

## 2023-05-02 DIAGNOSIS — Z5181 Encounter for therapeutic drug level monitoring: Secondary | ICD-10-CM

## 2023-05-02 DIAGNOSIS — I10 Essential (primary) hypertension: Secondary | ICD-10-CM

## 2023-05-02 MED ORDER — FLECAINIDE ACETATE 100 MG PO TABS
100.0000 mg | ORAL_TABLET | Freq: Two times a day (BID) | ORAL | 0 refills | Status: DC
Start: 2023-05-02 — End: 2023-06-06

## 2023-05-02 MED ORDER — HYDROCHLOROTHIAZIDE 12.5 MG PO CAPS
12.5000 mg | ORAL_CAPSULE | Freq: Every day | ORAL | 0 refills | Status: DC
Start: 2023-05-02 — End: 2023-06-06

## 2023-05-02 NOTE — Telephone Encounter (Signed)
Requested Prescriptions   Signed Prescriptions Disp Refills   hydrochlorothiazide (MICROZIDE) 12.5 MG capsule 30 capsule 0    Sig: Take 1 capsule (12.5 mg total) by mouth daily. Overdue follow up.  PLEASE CALL OFFICE TO SCHEDULE APPOINTMENT PRIOR TO NEXT REFILL    Authorizing Provider: Steffanie Dunn T    Ordering User: Feliberto Harts L   flecainide (TAMBOCOR) 100 MG tablet 60 tablet 0    Sig: Take 1 tablet (100 mg total) by mouth 2 (two) times daily. Overdue follow up.  PLEASE CALL OFFICE TO SCHEDULE APPOINTMENT PRIOR TO NEXT REFILL    Authorizing Provider: Lanier Prude    Ordering User: Guerry Minors

## 2023-05-02 NOTE — Telephone Encounter (Signed)
last visit on 11/01/22 with plan to f/u in 6 months, please schedule.  Thanks!

## 2023-05-08 NOTE — Telephone Encounter (Signed)
Left voicemail to schedule 6 month follow up appointment

## 2023-05-30 NOTE — Progress Notes (Signed)
Orthotics are in can be fit in [place] or Roy Horton

## 2023-06-04 ENCOUNTER — Other Ambulatory Visit: Payer: Self-pay | Admitting: Family Medicine

## 2023-06-04 DIAGNOSIS — E119 Type 2 diabetes mellitus without complications: Secondary | ICD-10-CM

## 2023-06-06 ENCOUNTER — Other Ambulatory Visit: Payer: Self-pay | Admitting: *Deleted

## 2023-06-06 ENCOUNTER — Other Ambulatory Visit: Payer: Self-pay | Admitting: Cardiology

## 2023-06-06 DIAGNOSIS — Z5181 Encounter for therapeutic drug level monitoring: Secondary | ICD-10-CM

## 2023-06-06 DIAGNOSIS — I480 Paroxysmal atrial fibrillation: Secondary | ICD-10-CM

## 2023-06-06 DIAGNOSIS — I10 Essential (primary) hypertension: Secondary | ICD-10-CM

## 2023-06-06 DIAGNOSIS — E669 Obesity, unspecified: Secondary | ICD-10-CM

## 2023-06-07 NOTE — Progress Notes (Signed)
Cardiology Office Note Date:  06/08/2023  Patient ID:  Roy, Horton [birth year], MRN [id number] PCP:  Joaquim Nam, MD  Cardiologist:  Debbe Odea, MD Electrophysiologist: Lanier Prude, MD   Chief Complaint: 6 mon follow-up  History of Present Illness: Roy Horton is a 47 y.o. male with PMH notable for SVT, parox Afib, HTN, T2DM, obesity; seen today for Lanier Prude, MD for routine electrophysiology followup.  He is s/p EP study 01/2021 without inducible SVT. He subsequently developed afib. He last saw Dr. Lalla Brothers 04/2022, good Afib control, slightly hypertensive in office.  I saw him 10/2022 for routine follow-up of flecainide. Was doing well, recently diagnosed with hyperglycemia and was trying to lower A1C with diet and exercise.   On follow-up today, he continues to do well from cardiac perspective. Does not think he has had any AFib episodes. He did have one episode of brief palpitations one evening after drinking a cup of coffee. No chest pain, chest pressure with episode, it was very brief, few seconds in duration. He feels better since switching BB to  nighttime, has less day-time fatigue. He does notice that he feels like he is dragging during the day, especially mid-morning. Has also noticed that within 30-45 minutes after taking metop at night he can not stay awake.   His A1C has continued to be elevated - had updated labs this AM by PCP. Has f/up with PCP scheduled for next week to discuss next steps in mgmt. He was doing very well with diet and exercise but "life happened".  Currently, he denies chest pain, palpitations, dyspnea, PND, orthopnea, nausea, vomiting, dizziness, syncope, edema, weight gain, or early satiety.     Past Medical History:  Diagnosis Date   Anxiety    GERD (gastroesophageal reflux disease)    OCC   History of concussion    Hyperglycemia    Migraines    improved with diet changes (cutting out MSG)   Plantar fascia  syndrome    s/p injection   Tachycardia     Past Surgical History:  Procedure Laterality Date   SHOULDER ARTHROSCOPY WITH OPEN ROTATOR CUFF REPAIR Left 03/30/2016   Procedure: SHOULDER ARTHROSCOPY WITH OPEN ROTATOR CUFF PARTIAL THICKNESS REPAIR;  Surgeon: Christena Flake, MD;  Location: [facility] ORS;  Service: Orthopedics;  Laterality: Left;   SHOULDER ARTHROSCOPY WITH SUBACROMIAL DECOMPRESSION Left 03/30/2016   Procedure: SHOULDER ARTHROSCOPY WITH SUBACROMIAL DECOMPRESSION AND DEBRIDEMENT;  Surgeon: Christena Flake, MD;  Location: [facility] ORS;  Service: Orthopedics;  Laterality: Left;   SVT ABLATION N/A 01/27/2021   Procedure: SVT ABLATION;  Surgeon: Lanier Prude, MD;  Location: [facility] INVASIVE CV LAB;  Service: Cardiovascular;  Laterality: N/A;   WISDOM TOOTH EXTRACTION      Current Outpatient Medications  Medication Instructions   acetaminophen (TYLENOL) 1,000 mg, Oral, Every 6 hours PRN   ALPRAZolam (XANAX) 0.5 mg, Oral, Daily PRN   aspirin EC 81 mg, Oral, Daily, Swallow whole.   flecainide (TAMBOCOR) 100 mg, Oral, 2 times daily   hydrochlorothiazide (MICROZIDE) 12.5 mg, Oral, Daily   lisinopril (ZESTRIL) 20 MG tablet TAKE 1 TABLET(20 MG) BY MOUTH DAILY   loratadine (CLARITIN) 10 mg, Oral, Daily   methocarbamol (ROBAXIN) 500 mg, Oral, Every 8 hours PRN   methylPREDNISolone (MEDROL DOSEPAK) 4 MG TBPK tablet 6 day dose pack - take as directed   metoprolol succinate (TOPROL-XL) 50 mg, Oral, Daily    Social History:  The patient  reports that  he has never smoked. He has never used smokeless tobacco. He reports that he does not drink alcohol and does not use drugs.   Family History:  The patient's family history includes Heart disease in his father.  ROS:  Please see the history of present illness. All other systems are reviewed and otherwise negative.   PHYSICAL EXAM:  VS:  Ht 6\' 3"  (1.905 m)   Wt (!) 311 lb 3.2 oz (141.2 kg)   BMI 38.90 kg/m  BMI: Body mass index is 38.9 kg/m.  GEN- The  patient is well appearing, alert and oriented x 3 today.   Lungs- Clear to ausculation bilaterally, normal work of breathing.  Heart- Regular rate and rhythm, no murmurs, rubs or gallops Extremities- No peripheral edema, warm, dry   EKG is ordered. Personal review of EKG from today shows:  EKG Interpretation Date/Time:  Friday June 08 2023 13:15:40 EDT Ventricular Rate:  61 PR Interval:  200 QRS Duration:  94 QT Interval:  410 QTC Calculation: 412 R Axis:   10  Text Interpretation: Normal sinus rhythm Normal ECG Confirmed by Sherie Don [phone]) on 06/08/2023 1:17:55 PM    11/01/2022 EKG: NSR, rate 61, PR interval 198; QRS 100    Recent Labs: 06/08/2023: ALT 38; BUN 17; Creatinine, Ser 0.71; Hemoglobin 14.1; Platelets 198.0; Potassium 4.3; Sodium 140; TSH 1.15  06/08/2023: Cholesterol 186; HDL 34.90; LDL Cholesterol 116; Total CHOL/HDL Ratio 5; Triglycerides 180.0; VLDL 36.0   Estimated Creatinine Clearance: 171.2 mL/min (by C-G formula based on SCr of 0.71 mg/dL).   Wt Readings from Last 3 Encounters:  06/08/23 (!) 311 lb 3.2 oz (141.2 kg)  02/19/23 (!) 307 lb (139.3 kg)  11/01/22 (!) 305 lb (138.3 kg)     Additional studies reviewed include: Previous EP, cardiology notes.   ETT, 06/08/2021 Normal exercise capacity. Peak METS 10.1. No exercise induced arrhythmias related to flecainide. No evidence of ischemia.  TTE 01/20/21  1. Left ventricular ejection fraction, by estimation, is 60 to 65%. The left ventricle has normal function. The left ventricle has no regional wall motion abnormalities. Left ventricular diastolic parameters were normal.   2. Right ventricular systolic function is normal. The right ventricular size is normal.   3. Left atrial size was mildly dilated.   4. The mitral valve is normal in structure. No evidence of mitral valve regurgitation. No evidence of mitral stenosis.   5. The aortic valve is normal in structure. Aortic valve regurgitation is  not visualized. No aortic stenosis is present.   6. The inferior vena cava is normal in size with greater than 50% respiratory variability, suggesting right atrial pressure of 3 mmHg.   ASSESSMENT AND PLAN:  #) parox Afib Well-controlled on flecainide + metop Stable EKG intervals from prior Fatigue improved since switching metop to night time, but still present Reduce toprol to 25mg  nightly   #) Hypercoag d/t parox afib CHA2DS2-VASc Score = 2 [CHF History: 0, HTN History: 1, Diabetes History: 1, Stroke History: 0, Vascular Disease History: 0, Age Score: 0, Gender Score: 0].  Therefore, the patient's annual risk of stroke is 2.2 %.    Discussed with new T2DM diagnoses, need to consider OAC. He is agreeable to this Will start 5mg  eliquis BID, office samples provided  #) HTN Well-controlled in office today Continue 12.5mg  hydrochlorothiazide, continue 20mg  lisinopril Reduce metop as above Continue to check BP several times a week and bring readings to follow-up appts Lifestyle modifications discussed including regular aerobic exercise, limiting  sodium and processed foods   Current medicines are reviewed at length with the patient today.   The patient does not have concerns regarding his medicines.  The following changes were made today:   REDUCE metoprolol to 25mg  nightly START 5mg  eliquis BID   Labs/ tests ordered today include:  Orders Placed This Encounter  Procedures   EKG 12-Lead     Disposition: Follow up with EP APP  in 3 months   Signed, Sherie Don, NP  06/08/23  1:17 PM  Electrophysiology [facility]

## 2023-06-08 ENCOUNTER — Ambulatory Visit: Payer: BC Managed Care – PPO | Attending: Cardiology | Admitting: Cardiology

## 2023-06-08 ENCOUNTER — Other Ambulatory Visit: Payer: BC Managed Care – PPO

## 2023-06-08 ENCOUNTER — Other Ambulatory Visit (INDEPENDENT_AMBULATORY_CARE_PROVIDER_SITE_OTHER): Payer: BC Managed Care – PPO

## 2023-06-08 ENCOUNTER — Ambulatory Visit: Payer: BC Managed Care – PPO

## 2023-06-08 ENCOUNTER — Encounter: Payer: Self-pay | Admitting: Cardiology

## 2023-06-08 VITALS — BP 130/80 | HR 61 | Ht 75.0 in | Wt 311.2 lb

## 2023-06-08 DIAGNOSIS — E119 Type 2 diabetes mellitus without complications: Secondary | ICD-10-CM | POA: Diagnosis not present

## 2023-06-08 DIAGNOSIS — Z5181 Encounter for therapeutic drug level monitoring: Secondary | ICD-10-CM

## 2023-06-08 DIAGNOSIS — I10 Essential (primary) hypertension: Secondary | ICD-10-CM

## 2023-06-08 DIAGNOSIS — Z79899 Other long term (current) drug therapy: Secondary | ICD-10-CM | POA: Diagnosis not present

## 2023-06-08 DIAGNOSIS — I480 Paroxysmal atrial fibrillation: Secondary | ICD-10-CM

## 2023-06-08 LAB — CBC WITH DIFFERENTIAL/PLATELET
Basophils Absolute: 0 10*3/uL (ref 0.0–0.1)
Basophils Relative: 0.3 % (ref 0.0–3.0)
Eosinophils Absolute: 0.2 10*3/uL (ref 0.0–0.7)
Eosinophils Relative: 4.1 % (ref 0.0–5.0)
HCT: 42.2 % (ref 39.0–52.0)
Hemoglobin: 14.1 g/dL (ref 13.0–17.0)
Lymphocytes Relative: 34.3 % (ref 12.0–46.0)
Lymphs Abs: 2.1 10*3/uL (ref 0.7–4.0)
MCHC: 33.4 g/dL (ref 30.0–36.0)
MCV: 86.3 fL (ref 78.0–100.0)
Monocytes Absolute: 0.4 10*3/uL (ref 0.1–1.0)
Monocytes Relative: 6.6 % (ref 3.0–12.0)
Neutro Abs: 3.4 10*3/uL (ref 1.4–7.7)
Neutrophils Relative %: 54.7 % (ref 43.0–77.0)
Platelets: 198 10*3/uL (ref 150.0–400.0)
RBC: 4.89 Mil/uL (ref 4.22–5.81)
RDW: 13.1 % (ref 11.5–15.5)
WBC: 6.1 10*3/uL (ref 4.0–10.5)

## 2023-06-08 LAB — COMPREHENSIVE METABOLIC PANEL
ALT: 38 U/L (ref 0–53)
AST: 25 U/L (ref 0–37)
Albumin: 4.2 g/dL (ref 3.5–5.2)
Alkaline Phosphatase: 44 U/L (ref 39–117)
BUN: 17 mg/dL (ref 6–23)
CO2: 32 meq/L (ref 19–32)
Calcium: 9.3 mg/dL (ref 8.4–10.5)
Chloride: 101 meq/L (ref 96–112)
Creatinine, Ser: 0.71 mg/dL (ref 0.40–1.50)
GFR: 108.55 mL/min (ref >60.00)
Glucose, Bld: 153 mg/dL — ABNORMAL HIGH (ref 70–99)
Potassium: 4.3 meq/L (ref 3.5–5.1)
Sodium: 140 meq/L (ref 135–145)
Total Bilirubin: 0.6 mg/dL (ref 0.2–1.2)
Total Protein: 6.9 g/dL (ref 6.0–8.3)

## 2023-06-08 LAB — LIPID PANEL
Cholesterol: 186 mg/dL (ref 0–200)
HDL: 34.9 mg/dL — ABNORMAL LOW (ref >39.00)
LDL Cholesterol: 116 mg/dL — ABNORMAL HIGH (ref 0–99)
NonHDL: 151.59
Total CHOL/HDL Ratio: 5
Triglycerides: 180 mg/dL — ABNORMAL HIGH (ref 0.0–149.0)
VLDL: 36 mg/dL (ref 0.0–40.0)

## 2023-06-08 LAB — HEMOGLOBIN A1C: Hgb A1c MFr Bld: 7.4 % — ABNORMAL HIGH (ref 4.6–6.5)

## 2023-06-08 LAB — TSH: TSH: 1.15 u[IU]/mL (ref 0.35–5.50)

## 2023-06-08 MED ORDER — APIXABAN 5 MG PO TABS: 5.0000 mg | ORAL_TABLET | Freq: Two times a day (BID) | ORAL | Status: DC

## 2023-06-08 MED ORDER — LISINOPRIL 20 MG PO TABS: 20.0000 mg | ORAL_TABLET | Freq: Every day | ORAL | 1 refills | Status: DC

## 2023-06-08 MED ORDER — FLECAINIDE ACETATE 100 MG PO TABS: 100.0000 mg | ORAL_TABLET | Freq: Two times a day (BID) | ORAL | 1 refills | Status: DC

## 2023-06-08 MED ORDER — HYDROCHLOROTHIAZIDE 12.5 MG PO CAPS: 12.5000 mg | ORAL_CAPSULE | Freq: Every day | ORAL | 1 refills | Status: DC

## 2023-06-08 MED ORDER — METOPROLOL SUCCINATE ER 25 MG PO TB24: 25.0000 mg | ORAL_TABLET | Freq: Every day | ORAL | 1 refills | Status: DC

## 2023-06-08 MED ORDER — APIXABAN 5 MG PO TABS: 5.0000 mg | ORAL_TABLET | Freq: Two times a day (BID) | ORAL | 1 refills | Status: DC

## 2023-06-08 NOTE — Progress Notes (Signed)
Patient presents today to pick up custom molded foot orthotics, diagnosed with PF by Dr. Lilian Kapur.   Orthotics were dispensed and fit was satisfactory. Reviewed instructions for break-in and wear. Written instructions given to patient.  Patient will follow up as needed.   Addison Bailey Cped, CFo, CFm

## 2023-06-08 NOTE — Patient Instructions (Signed)
Medication Instructions:  Your physician recommends the following medication changes.   START TAKING: Eliquis 5 MG twice daily.  DECREASE: Metoprolol Succinate to 25 MG daily.    *If you need a refill on your cardiac medications before your next appointment, please call your pharmacy*   Lab Work: None ordered.  If you have labs (blood work) drawn today and your tests are completely normal, you will receive your results only by: MyChart Message (if you have MyChart) OR A paper copy in the mail If you have any lab test that is abnormal or we need to change your treatment, we will call you to review the results.   Testing/Procedures: None ordered.    Follow-Up: At [facility], you and your health needs are our priority.  As part of our continuing mission to provide you with exceptional heart care, we have created designated Provider Care Teams.  These Care Teams include your primary Cardiologist (physician) and Advanced Practice Providers (APPs -  Physician Assistants and Nurse Practitioners) who all work together to provide you with the care you need, when you need it.  We recommend signing up for the patient portal called "MyChart".  Sign up information is provided on this After Visit Summary.  MyChart is used to connect with patients for Virtual Visits (Telemedicine).  Patients are able to view lab/test results, encounter notes, upcoming appointments, etc.  Non-urgent messages can be sent to your provider as well.   To learn more about what you can do with MyChart, go to [email or web address].    Your next appointment:   3 month(s)

## 2023-06-15 ENCOUNTER — Ambulatory Visit (INDEPENDENT_AMBULATORY_CARE_PROVIDER_SITE_OTHER): Payer: BC Managed Care – PPO | Admitting: Family Medicine

## 2023-06-15 ENCOUNTER — Encounter: Payer: Self-pay | Admitting: Family Medicine

## 2023-06-15 VITALS — BP 138/78 | HR 64 | Temp 99.1°F | Ht 75.5 in | Wt 303.4 lb

## 2023-06-15 DIAGNOSIS — Z0000 Encounter for general adult medical examination without abnormal findings: Secondary | ICD-10-CM | POA: Diagnosis not present

## 2023-06-15 DIAGNOSIS — Z1211 Encounter for screening for malignant neoplasm of colon: Secondary | ICD-10-CM

## 2023-06-15 DIAGNOSIS — E119 Type 2 diabetes mellitus without complications: Secondary | ICD-10-CM

## 2023-06-15 DIAGNOSIS — I10 Essential (primary) hypertension: Secondary | ICD-10-CM

## 2023-06-15 DIAGNOSIS — Z7189 Other specified counseling: Secondary | ICD-10-CM

## 2023-06-15 NOTE — Patient Instructions (Addendum)
Keep working on diet and exercise and recheck A1c at an office visit in about 3 months.   Take care.  Glad to see you.

## 2023-06-15 NOTE — Progress Notes (Unsigned)
CPE- See plan.  Routine anticipatory guidance given to patient.  See health maintenance.  The possibility exists that previously documented standard health maintenance information may have been brought forward from a previous encounter into this note.  If needed, that same information has been updated to reflect the current situation based on today's encounter.    Tetanus 2017 Flu 2023, encouraged for 2024.   PNA d/w pt.   Shingles not due.   Covid vaccine prev done.   D/w patient NF:[id number] for colon cancer screening, including IFOB vs. colonoscopy.  Risks and benefits of both were discussed and patient voiced understanding.  Pt elects for: cologuard.   Prostate cancer screening not due.  Living will d/w pt.  Wife designated if patient were incapacitated.  [facility]    Diabetes:  No meds.   Hypoglycemic episodes: no sx  Hyperglycemic episodes: no sx Feet problems: see below.   Blood Sugars averaging: not checked.   eye exam within last year: yes Labs d/w pt.    Hypertension:    Using medication without problems or lightheadedness: yes Chest pain with exertion:no Edema:no Short of breath:no  On lower dose of metoprolol in the meantime.  He feels some better w/o heart fluttering.  On eliquis.  Rationale d/w pt.    He needed treatment for his foot, had steroid injection and got orthotics.  Then had covid.  He is working on diet and has noted intentional weight loss.   PMH and SH reviewed  Meds, vitals, and allergies reviewed.   ROS: Per HPI.  Unless specifically indicated otherwise in HPI, the patient denies:  General: fever. Eyes: acute vision changes ENT: sore throat Cardiovascular: chest pain Respiratory: SOB GI: vomiting GU: dysuria Musculoskeletal: acute back pain Derm: acute rash Neuro: acute motor dysfunction Psych: worsening mood Endocrine: polydipsia Heme: bleeding Allergy: hayfever  GEN: nad, alert and oriented HEENT:  mucous membranes moist NECK: supple w/o LA CV: rrr. PULM: ctab, no inc wob ABD: soft, +bs EXT: no edema SKIN: well perfused.   The 10-year ASCVD risk score (Arnett DK, et al., 2019) is: 9.5%   Values used to calculate the score:     Age: 47 years     Sex: Male     Is Non-Hispanic African American: No     Diabetic: Yes     Tobacco smoker: No     Systolic Blood Pressure: 138 mmHg     Is BP treated: Yes     HDL Cholesterol: 34.9 mg/dL     Total Cholesterol: 186 mg/dL  D/w pt about statin indication but would defer given the hopes of sig DM improvement in the near future.

## 2023-06-17 NOTE — Assessment & Plan Note (Signed)
Living will d/w pt.  Wife designated if patient were incapacitated.   ?

## 2023-06-17 NOTE — Assessment & Plan Note (Signed)
Continue hydrochlorothiazide lisinopril and metoprolol.  He is on the lower dose of metoprolol and he feels better without heart fluttering.

## 2023-06-17 NOTE — Assessment & Plan Note (Signed)
Discussed his situation.  He had significant setbacks with diet and exercise earlier this year.  He is made significant changes in the meantime.  He is going to keep working on diet and exercise and recheck A1c at an office visit in about 3 months.

## 2023-06-17 NOTE — Assessment & Plan Note (Signed)
Tetanus 2017 Flu 2023, encouraged for 2024.   PNA d/w pt.   Shingles not due.   Covid vaccine prev done.   D/w patient VH:[id number] for colon cancer screening, including IFOB vs. colonoscopy.  Risks and benefits of both were discussed and patient voiced understanding.  Pt elects for: cologuard.   Prostate cancer screening not due.  Living will d/w pt.  Wife designated if patient were incapacitated.  [facility]

## 2023-07-21 LAB — COLOGUARD: COLOGUARD: NEGATIVE

## 2023-09-10 ENCOUNTER — Ambulatory Visit: Payer: BC Managed Care – PPO | Admitting: Cardiology

## 2023-09-17 ENCOUNTER — Ambulatory Visit (INDEPENDENT_AMBULATORY_CARE_PROVIDER_SITE_OTHER): Payer: 59 | Admitting: Family Medicine

## 2023-09-17 ENCOUNTER — Encounter: Payer: Self-pay | Admitting: Family Medicine

## 2023-09-17 VITALS — BP 136/82 | HR 54 | Temp 97.9°F | Ht 75.5 in | Wt 291.2 lb

## 2023-09-17 DIAGNOSIS — E119 Type 2 diabetes mellitus without complications: Secondary | ICD-10-CM

## 2023-09-17 LAB — POCT GLYCOSYLATED HEMOGLOBIN (HGB A1C): Hemoglobin A1C: 6.4 % — AB (ref 4.0–5.6)

## 2023-09-17 NOTE — Progress Notes (Signed)
 Diabetes:  No meds.  Hypoglycemic episodes: no sx unless prolonged fasting.  Hyperglycemic episodes: no sx Feet problems: no Blood Sugars averaging: not checked.   eye exam within last year: yes A1c d/w pt at [facility].  6.4   He had been working on diet and exercise.  He cut out carbs.   Down 12 lbs at clinic.  He isn't lightheaded.   D/w pt about BP meds and cautions.    Meds, vitals, and allergies reviewed.  ROS: Per HPI unless specifically indicated in ROS section   GEN: nad, alert and oriented HEENT: ncat NECK: supple w/o LA CV: rrr. PULM: ctab, no inc wob ABD: soft, +bs EXT: no edema SKIN: well perfused.

## 2023-09-17 NOTE — Patient Instructions (Addendum)
 If lightheaded, then stop hydrochlorothiazide .  Take care.  Glad to see you. Thanks for your effort.   Recheck in about 4 months- around June.  A1c at the visit.

## 2023-09-17 NOTE — Assessment & Plan Note (Signed)
 A1c clearly better.  He has been working on diet and exercise.  Continue as is.  No change in meds.  Recheck in about 4 months- around June.  A1c at the visit.   If lightheaded from weight loss then hold hydrochlorothiazide , d/w pt.    I thanked him for his effort.

## 2023-09-24 ENCOUNTER — Ambulatory Visit: Payer: 59 | Admitting: Cardiology

## 2023-10-07 ENCOUNTER — Other Ambulatory Visit: Payer: Self-pay | Admitting: Cardiology

## 2023-10-08 NOTE — Progress Notes (Unsigned)
 Electrophysiology Clinic Note    Date:  10/09/2023  Patient ID:  Roy Horton, Roy Horton, Roy Horton, MRN [id number] PCP:  Joaquim Nam, MD  Cardiologist:  Debbe Odea, MD Electrophysiologist: Lanier Prude, MD   Discussed the use of AI scribe software for clinical note transcription with the patient, who gave verbal consent to proceed.   Patient Profile    Chief Complaint: Afib follow-up  History of Present Illness: Roy Horton is a 48 y.o. male with PMH notable for SVT, parox AFib, HTN, T2DM, obesity; seen today for Lanier Prude, MD for routine electrophysiology followup.  His AFib has been well-controlled on flecainide. I saw him 06/2023 where he had new dx of T2DM, increasing chadsvasc score from 1 > 2. Eliquis started at that visit. He had been having daytime fatigue. BB decreased.   On follow-up today, he is doing very well from a cardiac standpoint. He has not had any recent AF episodes that he can recall. He has made lifestyle changes and A1c is reduced, has also had some weight loss.  He has had improvement in daytime fatigued with adjusting BB to nightly. BP by home readings 120-130 systolic, rarely 409W.  He is tolerating eliquis well, no bleeding concerns.    AAD History: Flecainide     ROS:  Please see the history of present illness. All other systems are reviewed and otherwise negative.    Physical Exam    VS:  BP (!) 148/84 (BP Location: Left Arm, Patient Position: Sitting)   Pulse (!) 58   Ht 6\' 4"  (1.93 m)   Wt 292 lb 9.6 oz (132.7 kg)   SpO2 99%   BMI 35.62 kg/m  BMI: Body mass index is 35.62 kg/m.  BP recheck - 140/92  Wt Readings from Last 3 Encounters:  10/09/23 292 lb 9.6 oz (132.7 kg)  09/17/23 291 lb 3.2 oz (132.1 kg)  06/15/23 (!) 303 lb 6.4 oz (137.6 kg)     GEN- The patient is well appearing, alert and oriented x 3 today.   Lungs- Clear to ausculation bilaterally, normal work of breathing.  Heart- Regular rate  and rhythm, no murmurs, rubs or gallops Extremities- No peripheral edema, warm, dry    Studies Reviewed   Previous EP, cardiology notes.    EKG is ordered. Personal review of EKG from today shows:    EKG Interpretation Date/Time:  Tuesday October 09 2023 10:03:42 EST Ventricular Rate:  58 PR Interval:  200 QRS Duration:  90 QT Interval:  416 QTC Calculation: 408 R Axis:   7  Text Interpretation: Sinus bradycardia Confirmed by Sherie Don [phone]) on 10/09/2023 10:05:22 AM    06/08/2023 EKG - SR at 61. PR , QRS 94; QT 410   ETT, 06/08/2021 Normal exercise capacity. Peak METS 10.1. No exercise induced arrhythmias related to flecainide. No evidence of ischemia.   TTE 6/Horton/22  1. Left ventricular ejection fraction, by estimation, is 60 to 65%. The left ventricle has normal function. The left ventricle has no regional wall motion abnormalities. Left ventricular diastolic parameters were normal.   2. Right ventricular systolic function is normal. The right ventricular size is normal.   3. Left atrial size was mildly dilated.   4. The mitral valve is normal in structure. No evidence of mitral valve regurgitation. No evidence of mitral stenosis.   5. The aortic valve is normal in structure. Aortic valve regurgitation is not visualized. No aortic stenosis is present.  6. The inferior vena cava is normal in size with greater than 50% respiratory variability, suggesting right atrial pressure of 3 mmHg.    Assessment and Plan     #) parox AFib No recent AF episodes EKG with stable intervals on flecainide 100mg  BID Continue 100mg  flecainide BID + 25mg  toprol nightly   #) Hypercoag d/t parox afib CHA2DS2-VASc Score = at least 2 [CHF History: 0, HTN History: 1, Diabetes History: 1, Stroke History: 0, Vascular Disease History: 0, Age Score: 0, Gender Score: 0].  Therefore, the patient's annual risk of stroke is 2.2 %.    Stroke ppx - 5mg  eliquis BID, appropriately dosed No  bleeding concerns Patient requests to consider stopping eliquis in the future if A1c normalizes   #) HTN Elevated in office, though lower by home readings Continue to monitor Continue lifestyle changes including increased physical activity       Current medicines are reviewed at length with the patient today.   The patient has concerns regarding his medicines.  The following changes were made today:  none  Labs/ tests ordered today include:  Orders Placed This Encounter  Procedures   EKG 12-Lead     Disposition: Follow up with Dr. Lalla Brothers or EP APP in 6 months   Signed, Sherie Don, NP  10/09/23  12:49 PM  Electrophysiology [facility]

## 2023-10-09 ENCOUNTER — Ambulatory Visit: Payer: 59 | Attending: Cardiology | Admitting: Cardiology

## 2023-10-09 VITALS — BP 142/90 | HR 58 | Ht 76.0 in | Wt 292.6 lb

## 2023-10-09 DIAGNOSIS — I480 Paroxysmal atrial fibrillation: Secondary | ICD-10-CM

## 2023-10-09 DIAGNOSIS — I10 Essential (primary) hypertension: Secondary | ICD-10-CM | POA: Diagnosis not present

## 2023-10-09 DIAGNOSIS — Z79899 Other long term (current) drug therapy: Secondary | ICD-10-CM

## 2023-10-09 DIAGNOSIS — Z5181 Encounter for therapeutic drug level monitoring: Secondary | ICD-10-CM | POA: Diagnosis not present

## 2023-10-09 DIAGNOSIS — D6869 Other thrombophilia: Secondary | ICD-10-CM

## 2023-10-09 MED ORDER — APIXABAN 5 MG PO TABS: 5.0000 mg | ORAL_TABLET | Freq: Two times a day (BID) | ORAL | 3 refills | Status: DC

## 2023-10-09 NOTE — Patient Instructions (Signed)
 Medication Instructions:   No medication changes  *If you need a refill on your cardiac medications before your next appointment, please call your pharmacy*      Follow-Up: At [facility], you and your health needs are our priority.  As part of our continuing mission to provide you with exceptional heart care, we have created designated Provider Care Teams.  These Care Teams include your primary Cardiologist (physician) and Advanced Practice Providers (APPs -  Physician Assistants and Nurse Practitioners) who all work together to provide you with the care you need, when you need it.  We recommend signing up for the patient portal called "MyChart".  Sign up information is provided on this After Visit Summary.  MyChart is used to connect with patients for Virtual Visits (Telemedicine).  Patients are able to view lab/test results, encounter notes, upcoming appointments, etc.  Non-urgent messages can be sent to your provider as well.   To learn more about what you can do with MyChart, go to [email or web address].    Your next appointment:   6 month(s)  Provider:   Steffanie Dunn, MD or Sherie Don, NP     Other Instructions  Check BP 2-3 times per week and record measurements Goal BP is less than 120/80

## 2023-12-01 ENCOUNTER — Other Ambulatory Visit: Payer: Self-pay | Admitting: Cardiology

## 2023-12-01 DIAGNOSIS — I480 Paroxysmal atrial fibrillation: Secondary | ICD-10-CM

## 2023-12-01 DIAGNOSIS — I10 Essential (primary) hypertension: Secondary | ICD-10-CM

## 2023-12-01 DIAGNOSIS — Z79899 Other long term (current) drug therapy: Secondary | ICD-10-CM

## 2023-12-01 DIAGNOSIS — Z5181 Encounter for therapeutic drug level monitoring: Secondary | ICD-10-CM

## 2023-12-04 ENCOUNTER — Other Ambulatory Visit: Payer: Self-pay | Admitting: Cardiology

## 2023-12-05 ENCOUNTER — Other Ambulatory Visit: Payer: Self-pay | Admitting: Cardiology

## 2023-12-10 NOTE — Telephone Encounter (Signed)
 Prescription refill request for Eliquis  received. Indication: PAF Last office visit: 10/09/23  Maylene Spear NP Scr: 0.71 on 06/08/23  Epic Age: 48 Weight: 132.7kg  Based on above findings Eliquis  5mg  twice daily is the appropriate dose.  Refill approved.

## 2024-01-15 ENCOUNTER — Ambulatory Visit: Payer: 59 | Admitting: Family Medicine

## 2024-04-08 ENCOUNTER — Encounter: Payer: Self-pay | Admitting: Family Medicine

## 2024-04-09 NOTE — Telephone Encounter (Signed)
 Printed and placed in your tray

## 2024-05-19 ENCOUNTER — Ambulatory Visit
Admission: EM | Admit: 2024-05-19 | Discharge: 2024-05-19 | Disposition: A | Discharge status: Home / Self Care | Attending: Emergency Medicine | Admitting: Emergency Medicine

## 2024-05-19 DIAGNOSIS — M545 Low back pain, unspecified: Secondary | ICD-10-CM

## 2024-05-19 DIAGNOSIS — G8929 Other chronic pain: Secondary | ICD-10-CM

## 2024-05-19 MED ORDER — DEXAMETHASONE SOD PHOSPHATE PF 10 MG/ML IJ SOLN
10.0000 mg | Freq: Once | INTRAMUSCULAR | Status: AC
  Administered 2024-05-19: 10 mg via INTRAMUSCULAR

## 2024-05-19 MED ORDER — BACLOFEN 10 MG PO TABS: 10.0000 mg | ORAL_TABLET | Freq: Three times a day (TID) | ORAL | 0 refills | Status: DC

## 2024-05-19 MED ORDER — METHYLPREDNISOLONE 4 MG PO TBPK: ORAL_TABLET | ORAL | 0 refills | Status: DC

## 2024-05-19 NOTE — Discharge Instructions (Addendum)
 Take the Medrol  Dosepak according to the package instructions starting tomorrow morning.  You will take it at breakfast and you will continue it for the next 6 days.  Take the baclofen, 10 mg every 8 hours, on a schedule for the next 48 hours and then as needed.  Apply moist heat to your back for 30 minutes at a time 2-3 times a day to improve blood flow to the area and help remove the lactic acid causing the spasm.  Follow the back exercises given at discharge.  Return for reevaluation for any new or worsening symptoms.

## 2024-05-19 NOTE — ED Provider Notes (Signed)
 [facility]    CSN: [id number] Arrival date & time: 05/19/24  1050      History   Chief Complaint Chief Complaint  Patient presents with   Back Pain    HPI Roy Horton is a 48 y.o. male.   HPI  48 year old male with past medical history significant for essential hypertension, sleep apnea, paroxysmal atrial fibrillation, diabetes, and recurrent low back pain presents for evaluation of 3 days worth of low back pain that is predominantly on the right-hand side and will occasionally radiate down into the right leg.  No recent injury or heavy lifting.  The pain is worse when transitioning from sitting to standing and vice versa.  He has been using heat, Tylenol , and methocarbamol  at home with limited improvement of symptoms.  Past Medical History:  Diagnosis Date   Anxiety    GERD (gastroesophageal reflux disease)    OCC   History of concussion    Hyperglycemia    Migraines    improved with diet changes (cutting out MSG)   Plantar fascia syndrome    s/p injection   Tachycardia     Patient Active Problem List   Diagnosis Date Noted   Heel pain 02/21/2023   Back pain 09/18/2022   Diabetes mellitus without complication (HCC) 06/14/2022   PAF (paroxysmal atrial fibrillation) (HCC) 06/06/2022   Daytime sleepiness 01/05/2021   Apnea, sleep 01/05/2021   Essential hypertension, benign 01/11/2020   Routine general medical examination at a health care facility 05/15/2018   Pain in joint, shoulder region 04/23/2015   Advance care planning 12/23/2014   Hyperglycemia 12/23/2014    Past Surgical History:  Procedure Laterality Date   SHOULDER ARTHROSCOPY WITH OPEN ROTATOR CUFF REPAIR Left 03/30/2016   Procedure: SHOULDER ARTHROSCOPY WITH OPEN ROTATOR CUFF PARTIAL THICKNESS REPAIR;  Surgeon: Norleen JINNY Maltos, MD;  Location: [facility] ORS;  Service: Orthopedics;  Laterality: Left;   SHOULDER ARTHROSCOPY WITH SUBACROMIAL DECOMPRESSION Left 03/30/2016   Procedure: SHOULDER  ARTHROSCOPY WITH SUBACROMIAL DECOMPRESSION AND DEBRIDEMENT;  Surgeon: Norleen JINNY Maltos, MD;  Location: [facility];  Service: Orthopedics;  Laterality: Left;   SVT ABLATION N/A 01/27/2021   Procedure: SVT ABLATION;  Surgeon: Cindie Ole DASEN, MD;  Location: [facility] INVASIVE CV LAB;  Service: Cardiovascular;  Laterality: N/A;   WISDOM TOOTH EXTRACTION         Home Medications    Prior to Admission medications   Medication Sig Start Date End Date Taking? Authorizing Provider  baclofen (LIORESAL) 10 MG tablet Take 1 tablet (10 mg total) by mouth 3 (three) times daily. 05/19/24  Yes Bernardino Ditch, NP  methylPREDNISolone  (MEDROL  DOSEPAK) 4 MG TBPK tablet Take according to the package insert. 05/19/24  Yes Bernardino Ditch, NP  acetaminophen  (TYLENOL ) 500 MG tablet Take 1,000 mg by mouth every 6 (six) hours as needed for moderate pain or headache.    [provider]  ALPRAZolam  (XANAX ) 0.5 MG tablet Take 1 tablet (0.5 mg total) by mouth daily as needed for anxiety (sedation caution). 06/05/22   Cleatus Arlyss RAMAN, MD  apixaban  (ELIQUIS ) 5 MG TABS tablet TAKE 1 TABLET(5 MG) BY MOUTH TWICE DAILY 12/10/23   Darliss Rogue, MD  flecainide  (TAMBOCOR ) 100 MG tablet TAKE 1 TABLET(100 MG) BY MOUTH TWICE DAILY 12/03/23   Riddle, Suzann, NP  hydrochlorothiazide  (MICROZIDE ) 12.5 MG capsule TAKE 1 CAPSULE(12.5 MG) BY MOUTH DAILY 12/03/23   Riddle, Suzann, NP  lisinopril  (ZESTRIL ) 20 MG tablet TAKE 1 TABLET(20 MG) BY MOUTH DAILY 12/03/23  Riddle, Suzann, NP  loratadine  (CLARITIN ) 10 MG tablet Take 1 tablet (10 mg total) by mouth daily. 06/05/22   Cleatus Arlyss RAMAN, MD  methocarbamol  (ROBAXIN ) 500 MG tablet Take 1 tablet (500 mg total) by mouth every 8 (eight) hours as needed for muscle spasms. 02/19/23   Cleatus Arlyss RAMAN, MD  metoprolol  succinate (TOPROL -XL) 25 MG 24 hr tablet TAKE 1 TABLET(25 MG) BY MOUTH DAILY 12/04/23   Riddle, Suzann, NP    Family History Family History  Problem Relation Age of Onset   Heart  disease Father    Colon cancer Neg Hx    Prostate cancer Neg Hx     Social History Social History   Tobacco Use   Smoking status: Never   Smokeless tobacco: Never  Vaping Use   Vaping status: Never Used  Substance Use Topics   Alcohol use: No    Alcohol/week: 0.0 standard drinks of alcohol   Drug use: No     Allergies   Avocado   Review of Systems Review of Systems  Musculoskeletal:  Positive for back pain.  Neurological:  Negative for weakness and numbness.     Physical Exam Triage Vital Signs ED Triage Vitals  Encounter Vitals Group     BP 05/19/24 1212 (!) 158/109     Girls Systolic BP Percentile --      Girls Diastolic BP Percentile --      Boys Systolic BP Percentile --      Boys Diastolic BP Percentile --      Pulse Rate 05/19/24 1212 62     Resp 05/19/24 1212 16     Temp 05/19/24 1212 98.3 F (36.8 C)     Temp Source 05/19/24 1212 Oral     SpO2 05/19/24 1212 98 %     Weight 05/19/24 1212 (!) 310 lb (140.6 kg)     Height 05/19/24 1212 6' 3 (1.905 m)     Head Circumference --      Peak Flow --      Pain Score 05/19/24 1215 4     Pain Loc --      Pain Education --      Exclude from Growth Chart --    No data found.  Updated Vital Signs BP (!) 158/109 (BP Location: Right Arm)   Pulse 62   Temp 98.3 F (36.8 C) (Oral)   Resp 16   Ht 6' 3 (1.905 m)   Wt (!) 310 lb (140.6 kg)   SpO2 98%   BMI 38.75 kg/m   Visual Acuity Right Eye Distance:   Left Eye Distance:   Bilateral Distance:    Right Eye Near:   Left Eye Near:    Bilateral Near:     Physical Exam Vitals and nursing note reviewed.  Constitutional:      Appearance: Normal appearance. He is not ill-appearing.  HENT:     Head: Normocephalic and atraumatic.  Musculoskeletal:        General: Tenderness present. No signs of injury.     Comments: No midline spinous process tenderness or step-off in the lower thoracic or lumbar spine.  Muscle tension and mild spasm present in the  right lower lumbar paraspinous region.  Pain increases with flexion, extension, and lateral rotation.  Patient also has a positive straight leg raise and contralateral straight leg raise.  Skin:    General: Skin is warm and dry.     Capillary Refill: Capillary refill takes less than 2 seconds.  Neurological:     General: No focal deficit present.     Mental Status: He is alert and oriented to person, place, and time.      UC Treatments / Results  Labs (all labs ordered are listed, but only abnormal results are displayed) Labs Reviewed - No data to display  EKG   Radiology No results found.  Procedures Procedures (including critical care time)  Medications Ordered in UC Medications  dexamethasone  (DECADRON ) injection 10 mg (has no administration in time range)    Initial Impression / Assessment and Plan / UC Course  I have reviewed the triage vital signs and the nursing notes.  Pertinent labs & imaging results that were available during my care of the patient were reviewed by me and considered in my medical decision making (see chart for details).   Patient is a nontoxic-appearing 48 year old male presenting for evaluation of acute on chronic low back pain.  He does have a history of atrial fibrillation and he is on Eliquis .  He has been using Tylenol  at home without improvement of symptoms, along with methocarbamol  and heat.  He has not had any recent injuries but he does report that periodically his back will flareup for no particular reason.  He cannot think of any provoking activities that contributed to his back pain at this time.  I will treat him for his acute on chronic low back pain with 10 mg of IM Decadron  here in clinic and discharge him home on a Medrol  Dosepak.  We also discussed switching his muscle relaxer to baclofen as it has less sedating properties than methocarbamol .  He should continue to apply moist heat to his back to improve blood flow would help flush the  lactic acid from his system.  Have also advised him that the better hydrated he is the easier will be for his body to close lactic acid.  I will also give him home physical therapy exercises to perform.  If his symptoms do not improve I have instructed the patient to follow-up with orthopedics as he may need dedicated physical therapy.   Final Clinical Impressions(s) / UC Diagnoses   Final diagnoses:  Acute on chronic low back pain     Discharge Instructions      Take the Medrol  Dosepak according to the package instructions starting tomorrow morning.  You will take it at breakfast and you will continue it for the next 6 days.  Take the baclofen, 10 mg every 8 hours, on a schedule for the next 48 hours and then as needed.  Apply moist heat to your back for 30 minutes at a time 2-3 times a day to improve blood flow to the area and help remove the lactic acid causing the spasm.  Follow the back exercises given at discharge.  Return for reevaluation for any new or worsening symptoms.      ED Prescriptions     Medication Sig Dispense Auth. Provider   methylPREDNISolone  (MEDROL  DOSEPAK) 4 MG TBPK tablet Take according to the package insert. 1 each Bernardino Ditch, NP   baclofen (LIORESAL) 10 MG tablet Take 1 tablet (10 mg total) by mouth 3 (three) times daily. 30 each Bernardino Ditch, NP      PDMP not reviewed this encounter.   Bernardino Ditch, NP 05/19/24 1248

## 2024-05-19 NOTE — ED Triage Notes (Signed)
 Pt c/o lower back pain x3 days. Hx of injury 20 yrs ago. Denies any recent injuries or heavy lifting. Has tried OTC meds w/o relief.

## 2024-07-14 ENCOUNTER — Ambulatory Visit
Admission: RE | Admit: 2024-07-14 | Discharge: 2024-07-14 | Disposition: A | Source: Ambulatory Visit | Attending: Family Medicine | Admitting: Family Medicine

## 2024-07-14 ENCOUNTER — Ambulatory Visit: Admitting: Family Medicine

## 2024-07-14 ENCOUNTER — Encounter: Payer: Self-pay | Admitting: Family Medicine

## 2024-07-14 VITALS — BP 136/82 | HR 74 | Temp 98.6°F | Ht 75.6 in | Wt 304.0 lb

## 2024-07-14 DIAGNOSIS — R739 Hyperglycemia, unspecified: Secondary | ICD-10-CM

## 2024-07-14 DIAGNOSIS — M79604 Pain in right leg: Secondary | ICD-10-CM

## 2024-07-14 DIAGNOSIS — I10 Essential (primary) hypertension: Secondary | ICD-10-CM

## 2024-07-14 LAB — CBC WITH DIFFERENTIAL/PLATELET
Basophils Absolute: 0 K/uL (ref 0.0–0.1)
Basophils Relative: 0.4 % (ref 0.0–3.0)
Eosinophils Absolute: 0.2 K/uL (ref 0.0–0.7)
Eosinophils Relative: 3.1 % (ref 0.0–5.0)
HCT: 41.6 % (ref 39.0–52.0)
Hemoglobin: 14.4 g/dL (ref 13.0–17.0)
Lymphocytes Relative: 32.3 % (ref 12.0–46.0)
Lymphs Abs: 2.3 K/uL (ref 0.7–4.0)
MCHC: 34.6 g/dL (ref 30.0–36.0)
MCV: 84.2 fl (ref 78.0–100.0)
Monocytes Absolute: 0.4 K/uL (ref 0.1–1.0)
Monocytes Relative: 5.7 % (ref 3.0–12.0)
Neutro Abs: 4.2 K/uL (ref 1.4–7.7)
Neutrophils Relative %: 58.5 % (ref 43.0–77.0)
Platelets: 248 K/uL (ref 150.0–400.0)
RBC: 4.94 Mil/uL (ref 4.22–5.81)
RDW: 13 % (ref 11.5–15.5)
WBC: 7.2 K/uL (ref 4.0–10.5)

## 2024-07-14 LAB — LIPID PANEL
Cholesterol: 183 mg/dL (ref 0–200)
HDL: 32.3 mg/dL — ABNORMAL LOW (ref >39.00)
LDL Cholesterol: 88 mg/dL (ref 0–99)
NonHDL: 151.12
Total CHOL/HDL Ratio: 6
Triglycerides: 316 mg/dL — ABNORMAL HIGH (ref 0.0–149.0)
VLDL: 63.2 mg/dL — ABNORMAL HIGH (ref 0.0–40.0)

## 2024-07-14 LAB — COMPREHENSIVE METABOLIC PANEL WITH GFR
ALT: 55 U/L — ABNORMAL HIGH (ref 0–53)
AST: 36 U/L (ref 0–37)
Albumin: 4.5 g/dL (ref 3.5–5.2)
Alkaline Phosphatase: 56 U/L (ref 39–117)
BUN: 15 mg/dL (ref 6–23)
CO2: 29 meq/L (ref 19–32)
Calcium: 9.7 mg/dL (ref 8.4–10.5)
Chloride: 97 meq/L (ref 96–112)
Creatinine, Ser: 0.73 mg/dL (ref 0.40–1.50)
GFR: 106.81 mL/min (ref >60.00)
Glucose, Bld: 305 mg/dL — ABNORMAL HIGH (ref 70–99)
Potassium: 3.8 meq/L (ref 3.5–5.1)
Sodium: 136 meq/L (ref 135–145)
Total Bilirubin: 0.7 mg/dL (ref 0.2–1.2)
Total Protein: 7.6 g/dL (ref 6.0–8.3)

## 2024-07-14 LAB — MICROALBUMIN / CREATININE URINE RATIO
Creatinine,U: 69.8 mg/dL
Microalb Creat Ratio: 15.4 mg/g (ref 0.0–30.0)
Microalb, Ur: 1.1 mg/dL (ref 0.0–1.9)

## 2024-07-14 LAB — TSH: TSH: 1.49 u[IU]/mL (ref 0.35–5.50)

## 2024-07-14 LAB — HEMOGLOBIN A1C: Hgb A1c MFr Bld: 9.3 % — ABNORMAL HIGH (ref 4.6–6.5)

## 2024-07-14 MED ORDER — BACLOFEN 10 MG PO TABS: 10.0000 mg | ORAL_TABLET | Freq: Three times a day (TID) | ORAL | 0 refills | Status: DC

## 2024-07-14 MED ORDER — METHYLPREDNISOLONE 4 MG PO TBPK: ORAL_TABLET | ORAL | 0 refills | Status: DC

## 2024-07-14 MED ORDER — ALPRAZOLAM 0.5 MG PO TABS: 0.5000 mg | ORAL_TABLET | Freq: Every day | ORAL | 0 refills | Status: AC | PRN

## 2024-07-14 NOTE — Progress Notes (Unsigned)
 CPE- See plan.  Routine anticipatory guidance given to patient.  See health maintenance.  The possibility exists that previously documented standard health maintenance information may have been brought forward from a previous encounter into this note.  If needed, that same information has been updated to reflect the current situation based on today's encounter.    Tetanus 2017 Flu 2023, encouraged for 2025 PNA d/w pt.   Shingles not due.   Covid vaccine prev done.   Cologuard neg 2024 Prostate cancer screening not due.  Living will d/w pt.  Wife designated if patient were incapacitated.  [facility]    Rare use of prn xanax , d/w pt.     H/o DM2.  No meds.   Hypoglycemic episodes: no Hyperglycemic episodes: no Feet problems: no Blood Sugars averaging: not checked.   eye exam within last year: yes- In [employer].   Labs pending 2025.    Back pain restarted after doing more physical work.  Prev steroid pack helped with prev flare.  Baclofen  helped prev.  Pain can radiate from the R lower back to the R foot.  No weakness.  No B/B sx.     Hypertension:               Using medication without problems or lightheadedness: occ lightheaded with sig position change after prolonged time down but that is mild, cautions d/w pt.   Chest pain with exertion:no Edema:no Short of breath:no   He has cardiology f/u pending.  Still on eliquis  with flecanide at baseline.  No bleeding.  He felt better with 25mg  instead of 50mg  metoprolol .  No recent fluttering/palpitations.    PMH and SH reviewed  Meds, vitals, and allergies reviewed.   ROS: Per HPI.  Unless specifically indicated otherwise in HPI, the patient denies:  General: fever. Eyes: acute vision changes ENT: sore throat Cardiovascular: chest pain Respiratory: SOB GI: vomiting GU: dysuria Musculoskeletal: acute back pain Derm: acute rash Neuro: acute motor dysfunction Psych: worsening  mood Endocrine: polydipsia Heme: bleeding Allergy: hayfever  GEN: nad, alert and oriented HEENT: mucous membranes moist NECK: supple w/o LA CV: rrr. PULM: ctab, no inc wob ABD: soft, +bs EXT: no edema SKIN: no acute rash R lower back ttp R SLR neg S/S grossly wnl BLE

## 2024-07-14 NOTE — Progress Notes (Unsigned)
  Electrophysiology Office Follow up Visit Note:    Date:  07/14/2024   ID:  Roy Horton, DOB 1976/05/25, MRN [id number]  PCP:  Roy Arlyss RAMAN, MD  [facility] Cardiologist:  Redell Cave, MD  [facility] Electrophysiologist:  OLE ONEIDA HOLTS, MD    Interval History:     Roy Horton is a 48 y.o. male who presents for a follow up visit.   The patient was last seen by Elvie on October 09, 2023.  The patient has a history of SVT, paroxysmal atrial fibrillation, hypertension and diabetes.  He takes flecainide  for rhythm control.        Past medical, surgical, social and family history were reviewed.  ROS:   Please see the history of present illness.    All other systems reviewed and are negative.  EKGs/Labs/Other Studies Reviewed:    The following studies were reviewed today:          Physical Exam:    VS:  There were no vitals taken for this visit.    Wt Readings from Last 3 Encounters:  05/19/24 (!) 310 lb (140.6 kg)  10/09/23 292 lb 9.6 oz (132.7 kg)  09/17/23 291 lb 3.2 oz (132.1 kg)     GEN: no distress CARD: RRR, No MRG RESP: No IWOB. CTAB.      ASSESSMENT:    No diagnosis found. PLAN:    In order of problems listed above:  #Paroxysmal atrial fibrillation #SVT #High risk medication monitoring-Flecainide  Good control of his arrhythmia on flecainide  and metoprolol  PR and QRS acceptable for ongoing flecainide  use Continue Eliquis   #Hypertension *** goal today.  Recommend checking blood pressures 1-2 times per week at home and recording the values.  Recommend bringing these recordings to the primary care physician.  I discussed my upcoming departure from [facility] during today's clinic appointment.  The patient will continue to follow-up with one of my EP partners moving forward.  Follow-up 6 months with EP APP    Signed, Ole Holts, MD, [facility], [facility] 07/14/2024 8:09 AM    Electrophysiology [facility]

## 2024-07-14 NOTE — Patient Instructions (Addendum)
 Go to the lab on the way out.   If you have mychart we'll likely use that to update you.    Take care.  Glad to see you. Restart steroid taper with food.    Xray and labs on the way out.

## 2024-07-16 ENCOUNTER — Encounter: Payer: Self-pay | Admitting: Cardiology

## 2024-07-16 ENCOUNTER — Telehealth: Payer: Self-pay | Admitting: Family Medicine

## 2024-07-16 ENCOUNTER — Ambulatory Visit: Attending: Cardiology | Admitting: Cardiology

## 2024-07-16 ENCOUNTER — Ambulatory Visit: Payer: Self-pay | Admitting: Family Medicine

## 2024-07-16 VITALS — BP 140/88 | HR 68 | Ht 75.0 in | Wt 300.8 lb

## 2024-07-16 DIAGNOSIS — I480 Paroxysmal atrial fibrillation: Secondary | ICD-10-CM | POA: Diagnosis not present

## 2024-07-16 DIAGNOSIS — I10 Essential (primary) hypertension: Secondary | ICD-10-CM

## 2024-07-16 DIAGNOSIS — Z5181 Encounter for therapeutic drug level monitoring: Secondary | ICD-10-CM | POA: Diagnosis not present

## 2024-07-16 DIAGNOSIS — Z79899 Other long term (current) drug therapy: Secondary | ICD-10-CM | POA: Diagnosis not present

## 2024-07-16 MED ORDER — HYDROCHLOROTHIAZIDE 25 MG PO TABS: 25.0000 mg | ORAL_TABLET | Freq: Every day | ORAL | 3 refills | Status: AC

## 2024-07-16 NOTE — Telephone Encounter (Signed)
 Please update patient.  See lab report.  I am awaiting the overread on his x-ray.  He has degenerative changes in his back but I do not see a fracture.  Thanks.

## 2024-07-16 NOTE — Assessment & Plan Note (Signed)
 Restart steroid taper with steroid cautions discussed.  Can use baclofen  if needed.  See notes on x-ray.  Okay for outpatient follow-up.

## 2024-07-16 NOTE — Patient Instructions (Signed)
 Medication Instructions:  Your physician has recommended you make the following change in your medication:  1) INCREASE hydrochlorothiazide  to 25 mg once daily *If you need a refill on your cardiac medications before your next appointment, please call your pharmacy*  Follow-Up: At [facility], you and your health needs are our priority.  As part of our continuing mission to provide you with exceptional heart care, our providers are all part of one team.  This team includes your primary Cardiologist (physician) and Advanced Practice Providers or APPs (Physician Assistants and Nurse Practitioners) who all work together to provide you with the care you need, when you need it.  Your next appointment:   6 months  Provider:   Suzann Riddle, NP

## 2024-07-16 NOTE — Assessment & Plan Note (Signed)
 Routine cautions given to patient.  Update me as needed.  Continue hydrochlorothiazide  lisinopril  and metoprolol .

## 2024-07-16 NOTE — Assessment & Plan Note (Signed)
 He has cardiology f/u pending.  Still on eliquis  with flecanide at baseline.  No bleeding.  He felt better with 25mg  instead of 50mg  metoprolol .  No recent fluttering/palpitations.   Would continue as is for now.  See notes on labs.

## 2024-07-16 NOTE — Assessment & Plan Note (Signed)
 Living will d/w pt.  Wife designated if patient were incapacitated.   ?

## 2024-07-16 NOTE — Assessment & Plan Note (Signed)
 Tetanus 2017 Flu 2023, encouraged for 2025 PNA d/w pt.   Shingles not due.   Covid vaccine prev done.   Cologuard neg 2024 Prostate cancer screening not due.  Living will d/w pt.  Wife designated if patient were incapacitated.  [facility]

## 2024-07-16 NOTE — Assessment & Plan Note (Signed)
See notes on labs.  Continue work on diet and exercise. 

## 2024-07-22 ENCOUNTER — Encounter: Payer: Self-pay | Admitting: Family Medicine

## 2024-07-23 NOTE — Telephone Encounter (Unsigned)
 Copied from CRM #[id number]. Topic: Clinical - Lab/Test Results >> Jul 23, 2024 10:19 AM Treva T wrote: Reason for CRM: Pt returning call to office to discuss lab results and imaging results.  Has additional questions regarding lab results in reference to checking blood sugars at home, as well as recommended blood sugar meter he should use to start checking blood sugars.  Pt states this is new to him so would like to discuss further in detail. Also imaging results.   Pt scheduled an office visit as per note in chart.  Can be reached back at [phone], to discuss questions further.

## 2024-07-23 NOTE — Telephone Encounter (Signed)
See results notes for documentation  

## 2024-07-27 ENCOUNTER — Other Ambulatory Visit: Payer: Self-pay | Admitting: Family Medicine

## 2024-07-28 ENCOUNTER — Ambulatory Visit: Admitting: Family Medicine

## 2024-08-12 ENCOUNTER — Ambulatory Visit: Admitting: Family Medicine

## 2024-08-12 ENCOUNTER — Encounter: Payer: Self-pay | Admitting: Family Medicine

## 2024-08-12 VITALS — BP 120/78 | HR 72 | Temp 98.6°F | Ht 75.0 in | Wt 298.2 lb

## 2024-08-12 DIAGNOSIS — M79604 Pain in right leg: Secondary | ICD-10-CM

## 2024-08-12 DIAGNOSIS — M549 Dorsalgia, unspecified: Secondary | ICD-10-CM

## 2024-08-12 DIAGNOSIS — M545 Low back pain, unspecified: Secondary | ICD-10-CM | POA: Diagnosis not present

## 2024-08-12 DIAGNOSIS — Z7984 Long term (current) use of oral hypoglycemic drugs: Secondary | ICD-10-CM | POA: Diagnosis not present

## 2024-08-12 DIAGNOSIS — E119 Type 2 diabetes mellitus without complications: Secondary | ICD-10-CM

## 2024-08-12 MED ORDER — BACLOFEN 10 MG PO TABS: 10.0000 mg | ORAL_TABLET | Freq: Two times a day (BID) | ORAL | 1 refills | Status: AC | PRN

## 2024-08-12 MED ORDER — METFORMIN HCL 500 MG PO TABS: 500.0000 mg | ORAL_TABLET | Freq: Two times a day (BID) | ORAL | 3 refills | Status: AC

## 2024-08-12 NOTE — Patient Instructions (Addendum)
 Refer to Dr. Frederic Don with [facility].  Let me know if you can't get an appointment.   I would start metformin  once a day for about 10 days, then increase to twice a day if tolerated.  Update me as needed.  Recheck in about 3 months.   A1c at the visit.

## 2024-08-12 NOTE — Progress Notes (Signed)
 DM2.  A1c and labs d/w pt.  Sugars have seen 200-300 at home.   He has a meter and strips, etc.    LFTs and TG d/w pt, with plan to address sugar to help both.    He is off steroids.  Taking BID baclofen  and that helps.  D/w pt about back clinic referral.  See orders.    Discussed patient about diabetes pathophysiology, his family history, and low carbohydrate diet going forward.  Discussed deferring some diabetes health maintenance issues until we can get follow-up A1c, etc.  Meds, vitals, and allergies reviewed.   ROS: Per HPI unless specifically indicated in ROS section   Nad Ncat Neck supple, no LA Rrr Ctab Abd soft, not ttp Skin well-perfused.

## 2024-08-17 NOTE — Assessment & Plan Note (Signed)
 Discussed patient about diabetes pathophysiology, his family history, and low carbohydrate diet going forward.  Discussed deferring some diabetes health maintenance issues until we can get follow-up A1c, etc.  LFTs and TG d/w pt, with plan to address sugar to help both.    Low carbohydrate diet handout discussed with patient.  Discussed exercise.  He is checking his sugar at home.  I would start metformin  once a day for about 10 days, then increase to twice a day if tolerated.  GI/routine cautions discussed with patient. Update me as needed.  Recheck in about 3 months.   A1c at the visit.

## 2024-08-17 NOTE — Assessment & Plan Note (Signed)
 Refer to orthopedics.  Continue baclofen  as needed.

## 2024-09-02 ENCOUNTER — Other Ambulatory Visit: Payer: Self-pay

## 2024-09-03 ENCOUNTER — Other Ambulatory Visit: Payer: Self-pay | Admitting: Cardiology

## 2024-09-05 MED ORDER — METOPROLOL SUCCINATE ER 25 MG PO TB24: 25.0000 mg | ORAL_TABLET | Freq: Every day | ORAL | 3 refills | Status: AC

## 2024-11-10 ENCOUNTER — Ambulatory Visit: Admitting: Family Medicine
